# Patient Record
Sex: Female | Born: 1985 | Race: White | Hispanic: No | Marital: Married | Smoking: Never smoker
Health system: Southern US, Community
[De-identification: ages and names within clinical notes are randomized; demographics above are authoritative.]

## PROBLEM LIST (undated history)

## (undated) ENCOUNTER — Inpatient Hospital Stay (HOSPITAL_COMMUNITY): Payer: Self-pay

## (undated) DIAGNOSIS — Z789 Other specified health status: Secondary | ICD-10-CM

## (undated) HISTORY — PX: NO PAST SURGERIES: SHX2092

---

## 2005-02-14 ENCOUNTER — Other Ambulatory Visit: Admission: RE | Admit: 2005-02-14 | Discharge: 2005-02-14 | Payer: Self-pay | Admitting: Family Medicine

## 2005-05-11 ENCOUNTER — Other Ambulatory Visit: Admission: RE | Admit: 2005-05-11 | Discharge: 2005-05-11 | Payer: Self-pay | Admitting: Endocrinology

## 2005-06-22 ENCOUNTER — Other Ambulatory Visit: Admission: RE | Admit: 2005-06-22 | Discharge: 2005-06-22 | Payer: Self-pay | Admitting: Interventional Radiology

## 2005-06-22 ENCOUNTER — Encounter: Admission: RE | Admit: 2005-06-22 | Discharge: 2005-06-22 | Payer: Self-pay | Admitting: Surgery

## 2005-06-22 ENCOUNTER — Encounter (INDEPENDENT_AMBULATORY_CARE_PROVIDER_SITE_OTHER): Payer: Self-pay | Admitting: *Deleted

## 2006-01-30 DIAGNOSIS — E041 Nontoxic single thyroid nodule: Secondary | ICD-10-CM | POA: Insufficient documentation

## 2006-03-06 ENCOUNTER — Other Ambulatory Visit: Admission: RE | Admit: 2006-03-06 | Discharge: 2006-03-06 | Payer: Self-pay | Admitting: Family Medicine

## 2007-03-14 ENCOUNTER — Other Ambulatory Visit: Admission: RE | Admit: 2007-03-14 | Discharge: 2007-03-14 | Payer: Self-pay | Admitting: Family Medicine

## 2008-06-08 ENCOUNTER — Other Ambulatory Visit: Admission: RE | Admit: 2008-06-08 | Discharge: 2008-06-08 | Payer: Self-pay | Admitting: Family Medicine

## 2010-04-10 NOTE — L&D Delivery Note (Signed)
Delivery Note  SVD viable female Apgars 9,9 over 2nd degree MLE.  Placenta delivered spontaneously intact with 3VC. Repair with 2-0 Chromic with good support and hemostasis noted and R/V exam confirms.  PH art was done.  [place] cord blood was not done.  Mother and baby were doing well.  EBL 400  Candice Camp, MD

## 2010-08-03 LAB — HEPATITIS B SURFACE ANTIGEN: Hepatitis B Surface Ag: NEGATIVE

## 2010-08-03 LAB — RPR: RPR: NONREACTIVE

## 2011-02-25 ENCOUNTER — Inpatient Hospital Stay (HOSPITAL_COMMUNITY)
Admission: AD | Admit: 2011-02-25 | Discharge: 2011-02-26 | Disposition: A | Discharge status: Home / Self Care | Payer: BC Managed Care – PPO | Source: Ambulatory Visit | Attending: Obstetrics and Gynecology | Admitting: Obstetrics and Gynecology

## 2011-02-25 ENCOUNTER — Encounter (HOSPITAL_COMMUNITY): Payer: Self-pay | Admitting: *Deleted

## 2011-02-25 DIAGNOSIS — O479 False labor, unspecified: Secondary | ICD-10-CM | POA: Insufficient documentation

## 2011-02-25 HISTORY — DX: Other specified health status: Z78.9

## 2011-02-25 NOTE — Progress Notes (Signed)
G1 at 38.0wks. SVE Friday and was 3/90. Spotting since then. Called on call nurse and told to come in. Having some contractions.

## 2011-02-25 NOTE — Progress Notes (Signed)
Pt reports uc's of varying degrees of intensit all day. Pt has had spotting since cervical exam in office on Friday.

## 2011-03-05 ENCOUNTER — Inpatient Hospital Stay (HOSPITAL_COMMUNITY)
Admission: AD | Admit: 2011-03-05 | Discharge: 2011-03-08 | DRG: 373 | Disposition: A | Discharge status: Home / Self Care | Payer: BC Managed Care – PPO | Source: Ambulatory Visit | Attending: Obstetrics and Gynecology | Admitting: Obstetrics and Gynecology

## 2011-03-05 ENCOUNTER — Inpatient Hospital Stay (HOSPITAL_COMMUNITY): Payer: BC Managed Care – PPO | Admitting: Anesthesiology

## 2011-03-05 ENCOUNTER — Encounter (HOSPITAL_COMMUNITY): Payer: Self-pay | Admitting: Anesthesiology

## 2011-03-05 DIAGNOSIS — O99892 Other specified diseases and conditions complicating childbirth: Secondary | ICD-10-CM | POA: Diagnosis present

## 2011-03-05 DIAGNOSIS — Z22330 Carrier of Group B streptococcus: Secondary | ICD-10-CM

## 2011-03-05 LAB — CBC
MCH: 29.3 pg (ref 26.0–34.0)
MCHC: 34.2 g/dL (ref 30.0–36.0)
Platelets: 258 10*3/uL (ref 150–400)

## 2011-03-05 MED ORDER — PHENYLEPHRINE 40 MCG/ML (10ML) SYRINGE FOR IV PUSH (FOR BLOOD PRESSURE SUPPORT)
80.0000 ug | PREFILLED_SYRINGE | INTRAVENOUS | Status: DC | PRN
  Filled 2011-03-05: qty 5

## 2011-03-05 MED ORDER — ONDANSETRON HCL 4 MG/2ML IJ SOLN: 4.0000 mg | Freq: Four times a day (QID) | INTRAMUSCULAR | Status: DC | PRN

## 2011-03-05 MED ORDER — DEXTROSE 5 % IV SOLN
2.5000 10*6.[IU] | INTRAVENOUS | Status: DC
  Administered 2011-03-05 – 2011-03-06 (×3): 2.5 10*6.[IU] via INTRAVENOUS
  Filled 2011-03-05 (×5): qty 2.5

## 2011-03-05 MED ORDER — LACTATED RINGERS IV SOLN
500.0000 mL | INTRAVENOUS | Status: DC | PRN
  Administered 2011-03-06 (×2): 500 mL via INTRAVENOUS

## 2011-03-05 MED ORDER — LACTATED RINGERS IV SOLN
INTRAVENOUS | Status: DC
  Administered 2011-03-05: 125 mL/h via INTRAVENOUS
  Administered 2011-03-05 – 2011-03-06 (×3): via INTRAVENOUS

## 2011-03-05 MED ORDER — FLEET ENEMA 7-19 GM/118ML RE ENEM: 1.0000 | ENEMA | RECTAL | Status: DC | PRN

## 2011-03-05 MED ORDER — OXYTOCIN 20 UNITS IN LACTATED RINGERS INFUSION - SIMPLE
125.0000 mL/h | Freq: Once | INTRAVENOUS | Status: AC
  Administered 2011-03-06: 999 mL/h via INTRAVENOUS

## 2011-03-05 MED ORDER — LACTATED RINGERS IV SOLN
500.0000 mL | Freq: Once | INTRAVENOUS | Status: AC
  Administered 2011-03-05: 500 mL via INTRAVENOUS

## 2011-03-05 MED ORDER — ACETAMINOPHEN 325 MG PO TABS: 650.0000 mg | ORAL_TABLET | ORAL | Status: DC | PRN

## 2011-03-05 MED ORDER — PENICILLIN G POTASSIUM 5000000 UNITS IJ SOLR
5.0000 10*6.[IU] | Freq: Once | INTRAVENOUS | Status: AC
  Administered 2011-03-05: 5 10*6.[IU] via INTRAVENOUS
  Filled 2011-03-05: qty 5

## 2011-03-05 MED ORDER — EPHEDRINE 5 MG/ML INJ: 10.0000 mg | INTRAVENOUS | Status: DC | PRN

## 2011-03-05 MED ORDER — LIDOCAINE HCL (PF) 1 % IJ SOLN
30.0000 mL | INTRAMUSCULAR | Status: DC | PRN
  Filled 2011-03-05: qty 30

## 2011-03-05 MED ORDER — IBUPROFEN 600 MG PO TABS: 600.0000 mg | ORAL_TABLET | Freq: Four times a day (QID) | ORAL | Status: DC | PRN

## 2011-03-05 MED ORDER — FENTANYL 2.5 MCG/ML BUPIVACAINE 1/10 % EPIDURAL INFUSION (WH - ANES)
14.0000 mL/h | INTRAMUSCULAR | Status: DC
  Administered 2011-03-06 (×2): 14 mL/h via EPIDURAL
  Filled 2011-03-05 (×3): qty 60

## 2011-03-05 MED ORDER — CITRIC ACID-SODIUM CITRATE 334-500 MG/5ML PO SOLN: 30.0000 mL | ORAL | Status: DC | PRN

## 2011-03-05 MED ORDER — OXYCODONE-ACETAMINOPHEN 5-325 MG PO TABS: 2.0000 | ORAL_TABLET | ORAL | Status: DC | PRN

## 2011-03-05 MED ORDER — DIPHENHYDRAMINE HCL 50 MG/ML IJ SOLN: 12.5000 mg | INTRAMUSCULAR | Status: DC | PRN

## 2011-03-05 MED ORDER — FENTANYL 2.5 MCG/ML BUPIVACAINE 1/10 % EPIDURAL INFUSION (WH - ANES)
INTRAMUSCULAR | Status: DC | PRN
  Administered 2011-03-05: 14 mL/h via EPIDURAL

## 2011-03-05 MED ORDER — EPHEDRINE 5 MG/ML INJ
10.0000 mg | INTRAVENOUS | Status: DC | PRN
  Filled 2011-03-05: qty 4

## 2011-03-05 MED ORDER — OXYTOCIN BOLUS FROM INFUSION
500.0000 mL | Freq: Once | INTRAVENOUS | Status: DC
  Filled 2011-03-05: qty 1000
  Filled 2011-03-05: qty 500

## 2011-03-05 MED ORDER — PHENYLEPHRINE 40 MCG/ML (10ML) SYRINGE FOR IV PUSH (FOR BLOOD PRESSURE SUPPORT): 80.0000 ug | PREFILLED_SYRINGE | INTRAVENOUS | Status: DC | PRN

## 2011-03-05 NOTE — H&P (Signed)
Shevon Sian is a 25 y.o. female presenting at 18.1 with increasing cx's and cx ghange.  Post GBS> Maternal Medical History:  Reason for admission: Reason for admission: contractions.  Contractions: Frequency: regular.   Perceived severity is moderate.    Prenatal Complications - Diabetes: none.    OB History    Grav Para Term Preterm Abortions TAB SAB Ect Mult Living   1              Past Medical History  Diagnosis Date  . No pertinent past medical history    Past Surgical History  Procedure Date  . No past surgeries    Family History: family history is not on file. Social History:  reports that she has never smoked. She does not have any smokeless tobacco history on file. She reports that she does not drink alcohol or use illicit drugs.  ROS  Dilation: 4 Effacement (%): 80 Station: 0 Exam by:: felkelrn Blood pressure 119/78, pulse 101, temperature 98.7 F (37.1 C), temperature source Oral, resp. rate 20, height 5\' 9"  (1.753 m), weight 80.196 kg (176 lb 12.8 oz). Maternal Exam:  Uterine Assessment: Contraction strength is moderate.  Contraction frequency is irregular.   Abdomen: Fundal height is c/w dates.   Estimated fetal weight is 7+.   Fetal presentation: vertex  Introitus: Amniotic fluid character: not assessed.  Pelvis: adequate for delivery.   Cervix: Cervix evaluated by digital exam.     Physical Exam  Prenatal labs: ABO, Rh: A/Negative/-- (11/25 0000) Antibody: Negative (11/25 0000) Rubella: Immune (04/25 0000) RPR: Nonreactive (04/25 0000)  HBsAg: Negative (04/25 0000)  HIV: Non-reactive (04/25 0000)  GBS: Positive (10/31 0000)   Assessment/Plan: IUP at 39.1 early labor. Post GBS. IV PEN G. Routine L and D.   Veronia Laprise S 03/05/2011, 8:05 PM

## 2011-03-05 NOTE — Anesthesia Preprocedure Evaluation (Signed)

## 2011-03-05 NOTE — Anesthesia Procedure Notes (Addendum)
Epidural Patient location during procedure: OB  Preanesthetic Checklist Completed: patient identified, site marked, surgical consent, pre-op evaluation, timeout performed, IV checked, risks and benefits discussed and monitors and equipment checked  Epidural Patient position: sitting Prep: site prepped and draped and DuraPrep Patient monitoring: continuous pulse ox and blood pressure Approach: midline Injection technique: LOR air  Needle:  Needle type: Tuohy  Needle gauge: 17 G Needle length: 9 cm Needle insertion depth: 6 cm Catheter type: closed end flexible Catheter size: 19 Gauge Catheter at skin depth: 10 cm Test dose: negative  Assessment Events: blood not aspirated, injection not painful, no injection resistance, negative IV test and no paresthesia  Additional Notes Dosing of Epidural:  1st dose, through needle ............................................Marland Kitchen epi 1:200K + Xylocaine 40 mg  2nd dose, through catheter, after waiting 3 minutes...Marland KitchenMarland Kitchenepi 1:200K + Xylocaine 60 mg  3rd dose, through catheter after waiting 3 minutes .............................Marcaine   5mg    ( mg Marcaine are expressed as equivilent  cc's medication removed from the 0.1%Bupiv / fentanyl syringe from L&D pump)  ( 2% Xylo charted as a single dose in Epic Meds for ease of charting; actual dosing was fractionated as above, for saftey's sake)  As each dose occurred, patient was free of IV sx; and patient exhibited no evidence of SA injection.  Patient is more comfortable after epidural dosed. Please see RN's note for documentation of vital signs,and FHR which are stable.

## 2011-03-05 NOTE — Progress Notes (Signed)
Pt reports having ctx on and off since last night. Now 5-6 min apart. Reports having some leaking as well. Pt reports fetal movement a little less than usual.

## 2011-03-06 ENCOUNTER — Encounter (HOSPITAL_COMMUNITY): Payer: Self-pay | Admitting: *Deleted

## 2011-03-06 LAB — RPR: RPR Ser Ql: NONREACTIVE

## 2011-03-06 MED ORDER — MEDROXYPROGESTERONE ACETATE 150 MG/ML IM SUSP: 150.0000 mg | INTRAMUSCULAR | Status: DC | PRN

## 2011-03-06 MED ORDER — SODIUM CHLORIDE 0.9 % IJ SOLN
3.0000 mL | Freq: Two times a day (BID) | INTRAMUSCULAR | Status: DC
  Administered 2011-03-06: 3 mL via INTRAVENOUS

## 2011-03-06 MED ORDER — IBUPROFEN 600 MG PO TABS
600.0000 mg | ORAL_TABLET | Freq: Four times a day (QID) | ORAL | Status: DC
  Administered 2011-03-06 – 2011-03-08 (×8): 600 mg via ORAL
  Filled 2011-03-06 (×8): qty 1

## 2011-03-06 MED ORDER — MEASLES, MUMPS & RUBELLA VAC ~~LOC~~ INJ
0.5000 mL | INJECTION | Freq: Once | SUBCUTANEOUS | Status: DC
  Filled 2011-03-06: qty 0.5

## 2011-03-06 MED ORDER — BENZOCAINE-MENTHOL 20-0.5 % EX AERO
INHALATION_SPRAY | CUTANEOUS | Status: AC
  Filled 2011-03-06: qty 56

## 2011-03-06 MED ORDER — ONDANSETRON HCL 4 MG PO TABS: 4.0000 mg | ORAL_TABLET | ORAL | Status: DC | PRN

## 2011-03-06 MED ORDER — ZOLPIDEM TARTRATE 5 MG PO TABS: 5.0000 mg | ORAL_TABLET | Freq: Every evening | ORAL | Status: DC | PRN

## 2011-03-06 MED ORDER — SENNOSIDES-DOCUSATE SODIUM 8.6-50 MG PO TABS
2.0000 | ORAL_TABLET | Freq: Every day | ORAL | Status: DC
  Administered 2011-03-06 – 2011-03-07 (×2): 2 via ORAL

## 2011-03-06 MED ORDER — PRENATAL PLUS 27-1 MG PO TABS
1.0000 | ORAL_TABLET | Freq: Every day | ORAL | Status: DC
  Filled 2011-03-06: qty 1

## 2011-03-06 MED ORDER — BENZOCAINE-MENTHOL 20-0.5 % EX AERO: 1.0000 "application " | INHALATION_SPRAY | CUTANEOUS | Status: DC | PRN

## 2011-03-06 MED ORDER — SIMETHICONE 80 MG PO CHEW: 80.0000 mg | CHEWABLE_TABLET | ORAL | Status: DC | PRN

## 2011-03-06 MED ORDER — OXYCODONE-ACETAMINOPHEN 5-325 MG PO TABS
1.0000 | ORAL_TABLET | ORAL | Status: DC | PRN
  Administered 2011-03-07 (×2): 1 via ORAL
  Administered 2011-03-07: 2 via ORAL
  Filled 2011-03-06: qty 2
  Filled 2011-03-06 (×2): qty 1

## 2011-03-06 MED ORDER — ONDANSETRON HCL 4 MG/2ML IJ SOLN: 4.0000 mg | INTRAMUSCULAR | Status: DC | PRN

## 2011-03-06 MED ORDER — TETANUS-DIPHTH-ACELL PERTUSSIS 5-2.5-18.5 LF-MCG/0.5 IM SUSP: 0.5000 mL | Freq: Once | INTRAMUSCULAR | Status: DC

## 2011-03-06 MED ORDER — LANOLIN HYDROUS EX OINT: TOPICAL_OINTMENT | CUTANEOUS | Status: DC | PRN

## 2011-03-06 MED ORDER — DIBUCAINE 1 % RE OINT: 1.0000 "application " | TOPICAL_OINTMENT | RECTAL | Status: DC | PRN

## 2011-03-06 MED ORDER — DIPHENHYDRAMINE HCL 25 MG PO CAPS: 25.0000 mg | ORAL_CAPSULE | Freq: Four times a day (QID) | ORAL | Status: DC | PRN

## 2011-03-06 MED ORDER — WITCH HAZEL-GLYCERIN EX PADS: 1.0000 "application " | MEDICATED_PAD | CUTANEOUS | Status: DC | PRN

## 2011-03-06 MED ORDER — PRENATAL PLUS 27-1 MG PO TABS
1.0000 | ORAL_TABLET | Freq: Every day | ORAL | Status: DC
  Administered 2011-03-07 – 2011-03-08 (×2): 1 via ORAL
  Filled 2011-03-06: qty 1

## 2011-03-06 NOTE — Anesthesia Postprocedure Evaluation (Signed)
  Anesthesia Post-op Note  Patient: Elizabeth [name]  Procedure(s) Performed: * No procedures listed *  Patient Location: PACU and Mother/Baby  Anesthesia Type: Epidural  Level of Consciousness: awake  Airway and Oxygen Therapy: Patient Spontanous Breathing  Post-op Pain: none  Post-op Assessment: Post-op Vital signs reviewed  Post-op Vital Signs: Reviewed and stable  Complications: No apparent anesthesia complications

## 2011-03-06 NOTE — Progress Notes (Signed)
Patient ID: Elizabeth [name], female   DOB: 05/22/1985, 25 y.o.   MRN: [id number] Fetal decel to 90's for 2 minutes now back up Probable srom clear cx 6-7 100 % vtx -1 FHR now up to 140's accel with scalp stim  Will follow

## 2011-03-07 LAB — CBC
MCH: 28.7 pg (ref 26.0–34.0)
MCHC: 32.9 g/dL (ref 30.0–36.0)
MCV: 87.3 fL (ref 78.0–100.0)
Platelets: 175 10*3/uL (ref 150–400)
RBC: 4.08 MIL/uL (ref 3.87–5.11)

## 2011-03-07 NOTE — Progress Notes (Signed)
Post Partum Day 1 Subjective: no complaints, up ad lib and voiding  Objective: Blood pressure 107/67, pulse 83, temperature 98.5 F (36.9 C), temperature source Oral, resp. rate 18, height 5\' 9"  (1.753 m), weight 80.196 kg (176 lb 12.8 oz), SpO2 98.00%, unknown if currently breastfeeding.  Physical Exam:  General: alert and cooperative Lochia: appropriate Uterine Fundus: firm Perineum intact DVT Evaluation: No evidence of DVT seen on physical exam.   Basename 03/07/11 0514 03/05/11 1841  HGB 11.7* 13.7  HCT 35.6* 40.1    Assessment/Plan: Plan for discharge tomorrow   LOS: 2 days   Tiahna Cure G 03/07/2011, 8:17 AM

## 2011-03-08 MED ORDER — OXYCODONE-ACETAMINOPHEN 5-325 MG PO TABS: 1.0000 | ORAL_TABLET | ORAL | Status: AC | PRN

## 2011-03-08 MED ORDER — IBUPROFEN 600 MG PO TABS: 600.0000 mg | ORAL_TABLET | Freq: Four times a day (QID) | ORAL | Status: AC

## 2011-03-08 NOTE — Progress Notes (Signed)
Post Partum Day 2 Subjective: no complaints, up ad lib, voiding, tolerating PO and + flatus  Objective: Blood pressure 118/72, pulse 84, temperature 97.5 F (36.4 C), temperature source Oral, resp. rate 18, height 5\' 9"  (1.753 m), weight 80.196 kg (176 lb 12.8 oz), SpO2 99.00%, unknown if currently breastfeeding.  Physical Exam:  General: alert and cooperative Lochia: appropriate Uterine Fundus: firm Perineum intact DVT Evaluation: No evidence of DVT seen on physical exam.   Basename 03/07/11 0514 03/05/11 1841  HGB 11.7* 13.7  HCT 35.6* 40.1    Assessment/Plan: Discharge home   LOS: 3 days   Elizabeth [name] G 03/08/2011, 8:32 AM

## 2011-03-08 NOTE — Discharge Summary (Signed)
Obstetric Discharge Summary Reason for Admission: onset of labor Prenatal Procedures: ultrasound Intrapartum Procedures: spontaneous vaginal delivery Postpartum Procedures: none Complications-Operative and Postpartum: 2 degree perineal laceration Hemoglobin  Date Value Range Status  03/07/2011 11.7* 12.0-15.0 ([name]/dL) Final     HCT  Date Value Range Status  03/07/2011 35.6* 36.0-46.0 (%) Final    Discharge Diagnoses: Term Pregnancy-delivered  Discharge Information: Date: 03/08/2011 Activity: pelvic rest Diet: routine Medications: PNV, Ibuprofen and Percocet Condition: stable Instructions: refer to practice specific booklet Discharge to: home   Newborn Data: Live born female  Birth Weight: 8 lb 1 oz (3657 [name]) APGAR: 9, 9  Home with mother.  Elizabeth [name] 03/08/2011, 9:00 AM

## 2012-03-06 ENCOUNTER — Inpatient Hospital Stay (HOSPITAL_COMMUNITY)
Admission: AD | Admit: 2012-03-06 | Discharge: 2012-03-06 | Disposition: A | Discharge status: Home / Self Care | Payer: BC Managed Care – PPO | Source: Ambulatory Visit | Attending: Obstetrics and Gynecology | Admitting: Obstetrics and Gynecology

## 2012-03-06 ENCOUNTER — Encounter (HOSPITAL_COMMUNITY): Payer: Self-pay | Admitting: *Deleted

## 2012-03-06 ENCOUNTER — Inpatient Hospital Stay (HOSPITAL_COMMUNITY): Payer: BC Managed Care – PPO

## 2012-03-06 DIAGNOSIS — O039 Complete or unspecified spontaneous abortion without complication: Secondary | ICD-10-CM

## 2012-03-06 MED ORDER — RHO D IMMUNE GLOBULIN 1500 UNIT/2ML IJ SOLN
300.0000 ug | Freq: Once | INTRAMUSCULAR | Status: DC
  Filled 2012-03-06: qty 2

## 2012-03-06 NOTE — MAU Note (Signed)
Pt reports having spotting  On and off since Sunday. Went to Appointment yesterday and had U/S . Told everything was OK Had a FHR. Today bleeding has been getting heavier and passing  Small golf ball sized clots. Having mild cramping as well

## 2012-03-06 NOTE — Progress Notes (Signed)
U/S C/W complete AB No retained POC  D/W patient and husband SAB. Instructions given, pelvic rest.  FU in office 2 weeks-patient will call. Rh negative-will need rhogam unless recently given.

## 2012-03-06 NOTE — MAU Provider Note (Signed)
History     CSN: [id number]  Arrival date and time: 03/06/12 2024   First Provider Initiated Contact with Patient 03/06/12 2103      Chief Complaint  Patient presents with  . Vaginal Bleeding   HPI  Pt is [redacted]w[redacted]d pregnant and has been spotting since Sunday night - she was seen in the office yesterday with ultrasound showing cardiac activity.  She started bleeding heavier this afternoon with clots.  She has not passed any tissue.  Past Medical History  Diagnosis Date  . No pertinent past medical history     Past Surgical History  Procedure Date  . No past surgeries     No family history on file.  History  Substance Use Topics  . Smoking status: Never Smoker   . Smokeless tobacco: Not on file  . Alcohol Use: No    Allergies:  Allergies  Allergen Reactions  . Neomycin Swelling    Eyelid swell shut    Prescriptions prior to admission  Medication Sig Dispense Refill  . prenatal vitamin w/FE, FA (PRENATAL 1 + 1) 27-1 MG TABS Take 1 tablet by mouth daily.          Review of Systems  Constitutional: Negative for fever and chills.  Gastrointestinal: Positive for abdominal pain. Negative for nausea, vomiting, diarrhea and constipation.  Genitourinary: Negative for dysuria and urgency.  Neurological: Negative for headaches.   Physical Exam   Blood pressure 123/67, pulse 85, temperature 98.2 F (36.8 C), temperature source Oral, resp. rate 18, height 5\' 9"  (1.753 m), weight 149 lb 9.6 oz (67.858 kg), last menstrual period 01/13/2012, unknown if currently breastfeeding.  Physical Exam  Nursing note and vitals reviewed. Constitutional: She is oriented to person, place, and time. She appears well-developed and well-nourished.       tearful  HENT:  Head: Normocephalic.  Eyes: Pupils are equal, round, and reactive to light.  Neck: Normal range of motion. Neck supple.  Cardiovascular: Normal rate.   Respiratory: Effort normal.  GI: Soft.  Genitourinary:   Deferred- pt went to ultrasound  Musculoskeletal: Normal range of motion.  Neurological: She is alert and oriented to person, place, and time.  Skin: Skin is warm and dry.  Psychiatric: She has a normal mood and affect.    MAU Course  Procedures Complete SAB in ultrasound; Dr. Henderson Cloud here to see pt and discuss RADIOLOGY REPORT*  Clinical Data: Vaginal bleeding.  OBSTETRIC <14 WK [place] AND TRANSVAGINAL OB US  Technique: Both transabdominal and transvaginal ultrasound  examinations were performed for complete evaluation of the  gestation as well as the maternal uterus, adnexal regions, and  pelvic cul-de-sac. Transvaginal technique was performed to assess  early pregnancy.  Comparison: None.  Intrauterine gestational sac: None seen.  Yolk sac: N/A  Embryo: N/A  Maternal uterus/adnexae:  The endometrial echo complex measures 1.8 cm in thickness, likely  reflecting recent postpartum state. Trace fluid is noted along the  endometrial canal; there may be a small amount of associated clot.  No retained products of conception are identified.  The ovaries are within normal limits. The right ovary measures 3.2  x 1.7 x 1.4 cm, while the left ovary measures 3.3 x 2.2 x 1.9 cm.  No suspicious adnexal masses are seen; there is no evidence for  ovarian torsion.  No free fluid is seen within the pelvic cul-de-sac.  IMPRESSION:  No intrauterine gestational sac seen. No evidence for retained  products of conception. Trace fluid and likely small  amount of  associated clot along the endometrial canal. Otherwise  unremarkable pelvic ultrasound.  Original Report Authenticated By: Tonia Ghent, M.D.          Assessment and Plan  Complete SAB Rh Neg- lab drawn for workup Pt will return on Friday 11/29 for injection  Ayva Veilleux 03/06/2012, 9:04 PM

## 2012-03-06 NOTE — Progress Notes (Signed)
Pt and husband visible upset and teary eyed. Gave them time lone to grieve. Comfort pack given

## 2012-03-08 ENCOUNTER — Inpatient Hospital Stay (HOSPITAL_COMMUNITY)
Admission: AD | Admit: 2012-03-08 | Discharge: 2012-03-08 | Disposition: A | Discharge status: Home / Self Care | Payer: BC Managed Care – PPO | Source: Ambulatory Visit | Attending: Obstetrics & Gynecology | Admitting: Obstetrics & Gynecology

## 2012-03-08 DIAGNOSIS — Z2989 Encounter for other specified prophylactic measures: Secondary | ICD-10-CM | POA: Insufficient documentation

## 2012-03-08 DIAGNOSIS — Z298 Encounter for other specified prophylactic measures: Secondary | ICD-10-CM | POA: Insufficient documentation

## 2012-03-08 MED ORDER — RHO D IMMUNE GLOBULIN 1500 UNIT/2ML IJ SOLN
300.0000 ug | Freq: Once | INTRAMUSCULAR | Status: AC
  Administered 2012-03-08: 300 ug via INTRAMUSCULAR
  Filled 2012-03-08: qty 2

## 2012-03-09 LAB — RH IG WORKUP (INCLUDES ABO/RH): Unit division: 0

## 2012-09-24 LAB — OB RESULTS CONSOLE GC/CHLAMYDIA
CHLAMYDIA, DNA PROBE: NEGATIVE
Gonorrhea: NEGATIVE

## 2012-09-24 LAB — OB RESULTS CONSOLE HEPATITIS B SURFACE ANTIGEN: Hepatitis B Surface Ag: NEGATIVE

## 2012-09-24 LAB — OB RESULTS CONSOLE RPR: RPR: NONREACTIVE

## 2012-09-24 LAB — OB RESULTS CONSOLE ANTIBODY SCREEN: Antibody Screen: NEGATIVE

## 2012-09-24 LAB — OB RESULTS CONSOLE ABO/RH: RH Type: NEGATIVE

## 2012-09-24 LAB — OB RESULTS CONSOLE RUBELLA ANTIBODY, IGM: Rubella: IMMUNE

## 2012-09-24 LAB — OB RESULTS CONSOLE HIV ANTIBODY (ROUTINE TESTING): HIV: NONREACTIVE

## 2013-01-08 ENCOUNTER — Ambulatory Visit (HOSPITAL_COMMUNITY)
Admission: RE | Admit: 2013-01-08 | Discharge: 2013-01-08 | Disposition: A | Discharge status: Home / Self Care | Payer: BC Managed Care – PPO | Source: Ambulatory Visit | Attending: Obstetrics & Gynecology | Admitting: Obstetrics & Gynecology

## 2013-01-08 ENCOUNTER — Other Ambulatory Visit (HOSPITAL_COMMUNITY): Payer: Self-pay | Admitting: Obstetrics & Gynecology

## 2013-01-08 DIAGNOSIS — Z0489 Encounter for examination and observation for other specified reasons: Secondary | ICD-10-CM

## 2013-01-09 ENCOUNTER — Encounter (HOSPITAL_COMMUNITY): Payer: Self-pay | Admitting: *Deleted

## 2013-01-14 ENCOUNTER — Ambulatory Visit (HOSPITAL_COMMUNITY): Admission: RE | Admit: 2013-01-14 | Payer: BC Managed Care – PPO | Source: Ambulatory Visit

## 2013-01-14 ENCOUNTER — Ambulatory Visit (HOSPITAL_COMMUNITY)
Admission: RE | Admit: 2013-01-14 | Discharge: 2013-01-14 | Disposition: A | Discharge status: Home / Self Care | Payer: BC Managed Care – PPO | Source: Ambulatory Visit | Attending: Obstetrics & Gynecology | Admitting: Obstetrics & Gynecology

## 2013-01-14 DIAGNOSIS — Z0489 Encounter for examination and observation for other specified reasons: Secondary | ICD-10-CM

## 2013-01-14 DIAGNOSIS — Z363 Encounter for antenatal screening for malformations: Secondary | ICD-10-CM | POA: Insufficient documentation

## 2013-01-14 DIAGNOSIS — Z1389 Encounter for screening for other disorder: Secondary | ICD-10-CM | POA: Insufficient documentation

## 2013-01-16 ENCOUNTER — Ambulatory Visit (HOSPITAL_COMMUNITY): Payer: BC Managed Care – PPO

## 2013-04-07 LAB — OB RESULTS CONSOLE GBS: STREP GROUP B AG: NEGATIVE

## 2013-04-10 NOTE — L&D Delivery Note (Signed)
Delivery Note At 10:34 PM a viable female was delivered via  (Presentation: vertex; ROA position).  Female placed on mom's abdomen and cord clamped x 2 and cut. Cord blood obtained.  Placenta status: delivered spontaneously and intact with 3 VC.    Anesthesia:  Local Episiotomy:  None  Lacerations:  2nd degree with right labial repaired Suture Repair: 3.0 vicryl rapide Est. Blood Loss (mL): 500 cc  Mom to postpartum.  Baby to Couplet care / Skin to Skin.  Ishmail Mcmanamon S 04/21/2013, 11:36 PM

## 2013-04-21 ENCOUNTER — Inpatient Hospital Stay (HOSPITAL_COMMUNITY)
Admission: AD | Admit: 2013-04-21 | Discharge: 2013-04-23 | DRG: 775 | Disposition: A | Discharge status: Home / Self Care | Payer: BC Managed Care – PPO | Source: Ambulatory Visit | Attending: Obstetrics and Gynecology | Admitting: Obstetrics and Gynecology

## 2013-04-21 ENCOUNTER — Encounter (HOSPITAL_COMMUNITY): Payer: Self-pay

## 2013-04-21 MED ORDER — LIDOCAINE HCL (PF) 1 % IJ SOLN
30.0000 mL | INTRAMUSCULAR | Status: DC | PRN
  Filled 2013-04-21: qty 30

## 2013-04-21 MED ORDER — CITRIC ACID-SODIUM CITRATE 334-500 MG/5ML PO SOLN: 30.0000 mL | ORAL | Status: DC | PRN

## 2013-04-21 MED ORDER — LIDOCAINE HCL (PF) 1 % IJ SOLN
INTRAMUSCULAR | Status: AC
  Filled 2013-04-21: qty 30

## 2013-04-21 MED ORDER — LACTATED RINGERS IV SOLN: 500.0000 mL | INTRAVENOUS | Status: DC | PRN

## 2013-04-21 MED ORDER — IBUPROFEN 600 MG PO TABS
600.0000 mg | ORAL_TABLET | Freq: Four times a day (QID) | ORAL | Status: DC | PRN
Start: 2013-04-21 — End: 2013-04-22
  Filled 2013-04-21: qty 1

## 2013-04-21 MED ORDER — OXYTOCIN 40 UNITS IN LACTATED RINGERS INFUSION - SIMPLE MED: 62.5000 mL/h | INTRAVENOUS | Status: DC

## 2013-04-21 MED ORDER — ACETAMINOPHEN 325 MG PO TABS: 650.0000 mg | ORAL_TABLET | ORAL | Status: DC | PRN

## 2013-04-21 MED ORDER — LACTATED RINGERS IV SOLN: INTRAVENOUS | Status: DC

## 2013-04-21 MED ORDER — FLEET ENEMA 7-19 GM/118ML RE ENEM: 1.0000 | ENEMA | RECTAL | Status: DC | PRN

## 2013-04-21 MED ORDER — IBUPROFEN 600 MG PO TABS
600.0000 mg | ORAL_TABLET | Freq: Four times a day (QID) | ORAL | Status: DC | PRN
  Administered 2013-04-22: 600 mg via ORAL
  Filled 2013-04-21: qty 1

## 2013-04-21 MED ORDER — OXYTOCIN 40 UNITS IN LACTATED RINGERS INFUSION - SIMPLE MED
INTRAVENOUS | Status: AC
  Filled 2013-04-21: qty 1000

## 2013-04-21 MED ORDER — ONDANSETRON HCL 4 MG/2ML IJ SOLN: 4.0000 mg | Freq: Four times a day (QID) | INTRAMUSCULAR | Status: DC | PRN

## 2013-04-21 MED ORDER — OXYCODONE-ACETAMINOPHEN 5-325 MG PO TABS: 1.0000 | ORAL_TABLET | ORAL | Status: DC | PRN

## 2013-04-21 MED ORDER — OXYTOCIN BOLUS FROM INFUSION
500.0000 mL | INTRAVENOUS | Status: DC
  Administered 2013-04-21: 500 mL via INTRAVENOUS

## 2013-04-21 MED ORDER — OXYCODONE-ACETAMINOPHEN 5-325 MG PO TABS
1.0000 | ORAL_TABLET | ORAL | Status: DC | PRN
  Administered 2013-04-21: 1 via ORAL
  Filled 2013-04-21: qty 2

## 2013-04-21 MED ORDER — LIDOCAINE HCL (PF) 1 % IJ SOLN
30.0000 mL | INTRAMUSCULAR | Status: DC | PRN
  Administered 2013-04-21: 30 mL via SUBCUTANEOUS
  Filled 2013-04-21: qty 30

## 2013-04-21 MED ORDER — OXYTOCIN BOLUS FROM INFUSION: 500.0000 mL | INTRAVENOUS | Status: DC

## 2013-04-21 MED ORDER — LACTATED RINGERS IV SOLN
INTRAVENOUS | Status: DC
  Administered 2013-04-21: 1000 mL via INTRAVENOUS

## 2013-04-21 NOTE — Progress Notes (Signed)
Called at home 10:20 PM re pt ad to MAU @ 8cm, advised on way See H+PE written at 10:41 in house >>>  called again at 11:31 PM, advised pt del by Dr Shawnie PonsPratt.  I went in to see pt with family, baby on her chest.  Requested charge RN write up SZP to report poor communication, I was never called after 8cm admission with request for epidural, never advised to come for delivery, never called to be informed pt had delivered

## 2013-04-21 NOTE — H&P (Signed)
Elizabeth [name] is a 28 y.o. female presenting for SOL. Maternal Medical History:  Reason for admission: Contractions.   Contractions: Onset was 3-5 hours ago.   Frequency: regular.   Perceived severity is moderate.    Fetal activity: Perceived fetal activity is normal.   Last perceived fetal movement was within the past hour.      OB History   Grav Para Term Preterm Abortions TAB SAB Ect Mult Living   3 1 1       1      Past Medical History  Diagnosis Date  . No pertinent past medical history    Past Surgical History  Procedure Laterality Date  . No past surgeries     Family History: family history is not on file. Social History:  reports that she has never smoked. She does not have any smokeless tobacco history on file. She reports that she does not drink alcohol or use illicit drugs.   Prenatal Transfer Tool  Maternal Diabetes: No Genetic Screening: Normal Maternal Ultrasounds/Referrals: Normal Fetal Ultrasounds or other Referrals:  None Maternal Substance Abuse:  No Significant Maternal Medications:  None Significant Maternal Lab Results:  None Other Comments:  None  ROS  Dilation: 7.5 Effacement (%): 90 Station: 0 Exam by:: Elie ConferK. Weiss RN Blood pressure 131/68, pulse 90, unknown if currently breastfeeding. Exam Physical Exam  Constitutional: She is oriented to person, place, and time. She appears well-developed and well-nourished.  HENT:  Head: Atraumatic.  Cardiovascular: Normal rate and regular rhythm.   Respiratory: Effort normal and breath sounds normal.  GI:  term FH/FHR 142  Genitourinary:  7-8 cm on adm to MAU  Musculoskeletal: Normal range of motion.  Neurological: She is alert and oriented to person, place, and time.    Prenatal labs: ABO, Rh: A/Negative/-- (06/17 0000) Antibody: Negative (06/17 0000) Rubella: Immune (06/17 0000) RPR: Nonreactive (06/17 0000)  HBsAg: Negative (06/17 0000)  HIV: Non-reactive (06/17 0000)  GBS: Negative (12/29  0000)   Assessment/Plan: Term IUP/labor   Meriel PicaHOLLAND,Nekeisha Aure M 04/21/2013, 10:41 PM

## 2013-04-22 ENCOUNTER — Encounter (HOSPITAL_COMMUNITY): Payer: Self-pay | Admitting: *Deleted

## 2013-04-22 LAB — CBC
HEMATOCRIT: 31.6 % — AB (ref 36.0–46.0)
Hemoglobin: 10.7 g/dL — ABNORMAL LOW (ref 12.0–15.0)
MCH: 28.2 pg (ref 26.0–34.0)
MCHC: 33.9 g/dL (ref 30.0–36.0)
MCV: 83.4 fL (ref 78.0–100.0)
Platelets: 230 10*3/uL (ref 150–400)
RBC: 3.79 MIL/uL — ABNORMAL LOW (ref 3.87–5.11)
RDW: 14 % (ref 11.5–15.5)
WBC: 17.7 10*3/uL — AB (ref 4.0–10.5)

## 2013-04-22 LAB — RPR: RPR: NONREACTIVE

## 2013-04-22 MED ORDER — IBUPROFEN 600 MG PO TABS
600.0000 mg | ORAL_TABLET | Freq: Four times a day (QID) | ORAL | Status: DC
  Administered 2013-04-22 – 2013-04-23 (×5): 600 mg via ORAL
  Filled 2013-04-22 (×4): qty 1

## 2013-04-22 MED ORDER — WITCH HAZEL-GLYCERIN EX PADS: 1.0000 "application " | MEDICATED_PAD | CUTANEOUS | Status: DC | PRN

## 2013-04-22 MED ORDER — BENZOCAINE-MENTHOL 20-0.5 % EX AERO
1.0000 "application " | INHALATION_SPRAY | CUTANEOUS | Status: DC | PRN
  Administered 2013-04-22: 1 via TOPICAL
  Filled 2013-04-22: qty 56

## 2013-04-22 MED ORDER — ONDANSETRON HCL 4 MG/2ML IJ SOLN: 4.0000 mg | INTRAMUSCULAR | Status: DC | PRN

## 2013-04-22 MED ORDER — TETANUS-DIPHTH-ACELL PERTUSSIS 5-2.5-18.5 LF-MCG/0.5 IM SUSP: 0.5000 mL | Freq: Once | INTRAMUSCULAR | Status: DC

## 2013-04-22 MED ORDER — ZOLPIDEM TARTRATE 5 MG PO TABS: 5.0000 mg | ORAL_TABLET | Freq: Every evening | ORAL | Status: DC | PRN

## 2013-04-22 MED ORDER — OXYCODONE-ACETAMINOPHEN 5-325 MG PO TABS: 1.0000 | ORAL_TABLET | ORAL | Status: DC | PRN

## 2013-04-22 MED ORDER — DIPHENHYDRAMINE HCL 25 MG PO CAPS: 25.0000 mg | ORAL_CAPSULE | Freq: Four times a day (QID) | ORAL | Status: DC | PRN

## 2013-04-22 MED ORDER — SENNOSIDES-DOCUSATE SODIUM 8.6-50 MG PO TABS
2.0000 | ORAL_TABLET | ORAL | Status: DC
  Administered 2013-04-22: 2 via ORAL
  Filled 2013-04-22: qty 2

## 2013-04-22 MED ORDER — PRENATAL MULTIVITAMIN CH
1.0000 | ORAL_TABLET | Freq: Every day | ORAL | Status: DC
  Administered 2013-04-22: 1 via ORAL
  Filled 2013-04-22: qty 1

## 2013-04-22 MED ORDER — LANOLIN HYDROUS EX OINT: TOPICAL_OINTMENT | CUTANEOUS | Status: DC | PRN

## 2013-04-22 MED ORDER — ONDANSETRON HCL 4 MG PO TABS: 4.0000 mg | ORAL_TABLET | ORAL | Status: DC | PRN

## 2013-04-22 MED ORDER — SIMETHICONE 80 MG PO CHEW: 80.0000 mg | CHEWABLE_TABLET | ORAL | Status: DC | PRN

## 2013-04-22 MED ORDER — DIBUCAINE 1 % RE OINT: 1.0000 "application " | TOPICAL_OINTMENT | RECTAL | Status: DC | PRN

## 2013-04-22 NOTE — Progress Notes (Signed)
UR completed 

## 2013-04-22 NOTE — Progress Notes (Signed)
Post Partum Day 1 Subjective: up ad lib, voiding and tolerating PO  Objective: Blood pressure 102/65, pulse 73, temperature 97.6 F (36.4 C), temperature source Oral, resp. rate 16, unknown if currently breastfeeding.  Physical Exam:  General: alert and cooperative Lochia: appropriate Uterine Fundus: firm Incision: perineum intact, edematous area noted on r labia DVT Evaluation: No evidence of DVT seen on physical exam. Negative Homan's sign. No cords or calf tenderness. No significant calf/ankle edema.   Recent Labs  04/22/13 0555  HGB 10.7*  HCT 31.6*    Assessment/Plan: Plan for discharge tomorrow   LOS: 1 day   CURTIS,CAROL G 04/22/2013, 8:14 AM

## 2013-04-23 LAB — CBC
HCT: 32.1 % — ABNORMAL LOW (ref 36.0–46.0)
Hemoglobin: 10.6 g/dL — ABNORMAL LOW (ref 12.0–15.0)
MCH: 28.2 pg (ref 26.0–34.0)
MCHC: 33 g/dL (ref 30.0–36.0)
MCV: 85.4 fL (ref 78.0–100.0)
Platelets: 222 10*3/uL (ref 150–400)
RBC: 3.76 MIL/uL — AB (ref 3.87–5.11)
RDW: 14.6 % (ref 11.5–15.5)
WBC: 11 10*3/uL — AB (ref 4.0–10.5)

## 2013-04-23 LAB — TYPE AND SCREEN
ABO/RH(D): A NEG
ANTIBODY SCREEN: POSITIVE
DAT, IgG: NEGATIVE
UNIT DIVISION: 0
Unit division: 0

## 2013-04-23 MED ORDER — IBUPROFEN 600 MG PO TABS: 600.0000 mg | ORAL_TABLET | Freq: Four times a day (QID) | ORAL | Status: DC | PRN

## 2013-04-23 NOTE — Discharge Summary (Signed)
Obstetric Discharge Summary Reason for Admission: onset of labor Prenatal Procedures: ultrasound Intrapartum Procedures: spontaneous vaginal delivery Postpartum Procedures: none Complications-Operative and Postpartum: 2 degree perineal laceration Hemoglobin  Date Value Range Status  04/23/2013 10.6* 12.0 - 15.0 g/dL Final     HCT  Date Value Range Status  04/23/2013 32.1* 36.0 - 46.0 % Final    Physical Exam:  General: alert and cooperative Lochia: appropriate Uterine Fundus: firm Incision: perineum intact,right  labial varicosities improved DVT Evaluation: No evidence of DVT seen on physical exam. Negative Homan's sign. No cords or calf tenderness. No significant calf/ankle edema.  Discharge Diagnoses: Term Pregnancy-delivered  Discharge Information: Date: 04/23/2013 Activity: pelvic rest Diet: routine Medications: PNV and Ibuprofen Condition: stable Instructions: refer to practice specific booklet Discharge to: home   Newborn Data: Live born female  Birth Weight: 7 lb 10.8 oz (3481 g) APGAR: 8, 9  Home with mother.  Elizabeth [name] G 04/23/2013, 8:05 AM

## 2014-02-09 ENCOUNTER — Encounter (HOSPITAL_COMMUNITY): Payer: Self-pay | Admitting: *Deleted

## 2014-03-12 LAB — OB RESULTS CONSOLE ANTIBODY SCREEN: Antibody Screen: NEGATIVE

## 2014-03-12 LAB — OB RESULTS CONSOLE RUBELLA ANTIBODY, IGM: RUBELLA: IMMUNE

## 2014-03-12 LAB — OB RESULTS CONSOLE ABO/RH: RH Type: NEGATIVE

## 2014-03-12 LAB — OB RESULTS CONSOLE HIV ANTIBODY (ROUTINE TESTING): HIV: NONREACTIVE

## 2014-03-12 LAB — OB RESULTS CONSOLE RPR: RPR: NONREACTIVE

## 2014-03-12 LAB — OB RESULTS CONSOLE HEPATITIS B SURFACE ANTIGEN: Hepatitis B Surface Ag: NEGATIVE

## 2014-04-10 NOTE — L&D Delivery Note (Addendum)
Delivery Note At 3:50 AM a viable and healthy female was delivered via Vaginal, Spontaneous Delivery (Presentation: ; Occiput Anterior).  APGAR: 8, 9; weight -pending Placenta status: Intact, Spontaneous.  Cord: no complications.  Cord pH: n/a  Anesthesia: Epidural  Episiotomy: None Lacerations: 2nd degree Suture Repair: 3.0 vicryl rapide Est. Blood Loss (mL):  350 cc  Most bleeding was from perineal vein and was controlled with several stitches.   Mom to postpartum.  Baby to Couplet care / Skin to Skin.  Norrine Ballester R 10/10/2014, 4:28 AM

## 2014-08-14 LAB — OB RESULTS CONSOLE GBS
GBS: POSITIVE
GBS: POSITIVE
STREP GROUP B AG: POSITIVE
STREP GROUP B AG: POSITIVE

## 2014-08-17 LAB — OB RESULTS CONSOLE GBS: GBS: POSITIVE

## 2014-09-10 LAB — OB RESULTS CONSOLE GBS
GBS: POSITIVE
STREP GROUP B AG: POSITIVE

## 2014-09-21 LAB — OB RESULTS CONSOLE GBS: STREP GROUP B AG: POSITIVE

## 2014-10-02 ENCOUNTER — Encounter (HOSPITAL_COMMUNITY): Payer: Self-pay | Admitting: *Deleted

## 2014-10-02 ENCOUNTER — Inpatient Hospital Stay (HOSPITAL_COMMUNITY)
Admission: AD | Admit: 2014-10-02 | Discharge: 2014-10-02 | Disposition: A | Discharge status: Home / Self Care | Payer: No Typology Code available for payment source | Source: Ambulatory Visit | Attending: Obstetrics | Admitting: Obstetrics

## 2014-10-02 DIAGNOSIS — Z3493 Encounter for supervision of normal pregnancy, unspecified, third trimester: Secondary | ICD-10-CM | POA: Insufficient documentation

## 2014-10-02 DIAGNOSIS — Z3A38 38 weeks gestation of pregnancy: Secondary | ICD-10-CM | POA: Insufficient documentation

## 2014-10-02 HISTORY — DX: Other specified health status: Z78.9

## 2014-10-02 NOTE — MAU Note (Signed)
Pt reports having ctx abut q5-7 min since about 1230. Denies SROM or bleeding and reports good fetal movement

## 2014-10-07 ENCOUNTER — Telehealth (HOSPITAL_COMMUNITY): Payer: Self-pay | Admitting: *Deleted

## 2014-10-07 ENCOUNTER — Encounter (HOSPITAL_COMMUNITY): Payer: Self-pay | Admitting: *Deleted

## 2014-10-07 ENCOUNTER — Other Ambulatory Visit: Payer: Self-pay | Admitting: Obstetrics & Gynecology

## 2014-10-07 NOTE — Telephone Encounter (Signed)
Preadmission screen  

## 2014-10-09 ENCOUNTER — Inpatient Hospital Stay (HOSPITAL_COMMUNITY)
Admission: AD | Admit: 2014-10-09 | Discharge: 2014-10-11 | DRG: 775 | Disposition: A | Discharge status: Home / Self Care | Payer: No Typology Code available for payment source | Source: Ambulatory Visit | Attending: Obstetrics & Gynecology | Admitting: Obstetrics & Gynecology

## 2014-10-09 ENCOUNTER — Inpatient Hospital Stay (HOSPITAL_COMMUNITY): Payer: No Typology Code available for payment source | Admitting: Anesthesiology

## 2014-10-09 ENCOUNTER — Inpatient Hospital Stay (HOSPITAL_COMMUNITY): Admission: RE | Admit: 2014-10-09 | Payer: MEDICAID | Source: Ambulatory Visit

## 2014-10-09 DIAGNOSIS — Z3483 Encounter for supervision of other normal pregnancy, third trimester: Secondary | ICD-10-CM | POA: Diagnosis present

## 2014-10-09 DIAGNOSIS — O26899 Other specified pregnancy related conditions, unspecified trimester: Secondary | ICD-10-CM | POA: Diagnosis present

## 2014-10-09 DIAGNOSIS — O99824 Streptococcus B carrier state complicating childbirth: Principal | ICD-10-CM | POA: Diagnosis present

## 2014-10-09 DIAGNOSIS — Z3A39 39 weeks gestation of pregnancy: Secondary | ICD-10-CM | POA: Diagnosis present

## 2014-10-09 DIAGNOSIS — Z6791 Unspecified blood type, Rh negative: Secondary | ICD-10-CM | POA: Diagnosis present

## 2014-10-09 DIAGNOSIS — IMO0001 Reserved for inherently not codable concepts without codable children: Secondary | ICD-10-CM

## 2014-10-09 LAB — CBC
HEMATOCRIT: 38.7 % (ref 36.0–46.0)
Hemoglobin: 13.2 g/dL (ref 12.0–15.0)
MCH: 29.5 pg (ref 26.0–34.0)
MCHC: 34.1 g/dL (ref 30.0–36.0)
MCV: 86.4 fL (ref 78.0–100.0)
Platelets: 235 10*3/uL (ref 150–400)
RBC: 4.48 MIL/uL (ref 3.87–5.11)
RDW: 14.4 % (ref 11.5–15.5)
WBC: 13.3 10*3/uL — AB (ref 4.0–10.5)

## 2014-10-09 LAB — TYPE AND SCREEN
ABO/RH(D): A NEG
Antibody Screen: NEGATIVE

## 2014-10-09 MED ORDER — BUPIVACAINE HCL (PF) 0.25 % IJ SOLN
INTRAMUSCULAR | Status: DC | PRN
  Administered 2014-10-09 (×2): 4 mL

## 2014-10-09 MED ORDER — FENTANYL 2.5 MCG/ML BUPIVACAINE 1/10 % EPIDURAL INFUSION (WH - ANES)
14.0000 mL/h | INTRAMUSCULAR | Status: DC | PRN
  Administered 2014-10-09 (×2): 14 mL/h via EPIDURAL
  Filled 2014-10-09: qty 125

## 2014-10-09 MED ORDER — ACETAMINOPHEN 325 MG PO TABS: 650.0000 mg | ORAL_TABLET | ORAL | Status: DC | PRN

## 2014-10-09 MED ORDER — LIDOCAINE-EPINEPHRINE (PF) 2 %-1:200000 IJ SOLN
INTRAMUSCULAR | Status: DC | PRN
  Administered 2014-10-09: 3 mL

## 2014-10-09 MED ORDER — FLEET ENEMA 7-19 GM/118ML RE ENEM: 1.0000 | ENEMA | RECTAL | Status: DC | PRN

## 2014-10-09 MED ORDER — EPHEDRINE 5 MG/ML INJ
10.0000 mg | INTRAVENOUS | Status: DC | PRN
Start: 2014-10-09 — End: 2014-10-10
  Filled 2014-10-09: qty 2

## 2014-10-09 MED ORDER — CITRIC ACID-SODIUM CITRATE 334-500 MG/5ML PO SOLN: 30.0000 mL | ORAL | Status: DC | PRN

## 2014-10-09 MED ORDER — PENICILLIN G POTASSIUM 5000000 UNITS IJ SOLR: 2.5000 10*6.[IU] | INTRAVENOUS | Status: DC

## 2014-10-09 MED ORDER — OXYCODONE-ACETAMINOPHEN 5-325 MG PO TABS: 1.0000 | ORAL_TABLET | ORAL | Status: DC | PRN

## 2014-10-09 MED ORDER — LACTATED RINGERS IV SOLN: INTRAVENOUS | Status: DC

## 2014-10-09 MED ORDER — OXYCODONE-ACETAMINOPHEN 5-325 MG PO TABS: 2.0000 | ORAL_TABLET | ORAL | Status: DC | PRN

## 2014-10-09 MED ORDER — PHENYLEPHRINE 40 MCG/ML (10ML) SYRINGE FOR IV PUSH (FOR BLOOD PRESSURE SUPPORT)
80.0000 ug | PREFILLED_SYRINGE | INTRAVENOUS | Status: DC | PRN
Start: 2014-10-09 — End: 2014-10-10
  Filled 2014-10-09: qty 2
  Filled 2014-10-09: qty 20

## 2014-10-09 MED ORDER — LIDOCAINE HCL (PF) 1 % IJ SOLN
30.0000 mL | INTRAMUSCULAR | Status: DC | PRN
  Filled 2014-10-09: qty 30

## 2014-10-09 MED ORDER — PENICILLIN G POTASSIUM 5000000 UNITS IJ SOLR: 5.0000 10*6.[IU] | Freq: Once | INTRAVENOUS | Status: DC

## 2014-10-09 MED ORDER — LABETALOL HCL 200 MG PO TABS: 200.0000 mg | ORAL_TABLET | Freq: Two times a day (BID) | ORAL | Status: DC

## 2014-10-09 MED ORDER — PENICILLIN G POTASSIUM 5000000 UNITS IJ SOLR
5.0000 10*6.[IU] | Freq: Once | INTRAVENOUS | Status: AC
  Administered 2014-10-09: 5 10*6.[IU] via INTRAVENOUS
  Filled 2014-10-09: qty 5

## 2014-10-09 MED ORDER — LACTATED RINGERS IV SOLN: 500.0000 mL | INTRAVENOUS | Status: DC | PRN

## 2014-10-09 MED ORDER — OXYTOCIN BOLUS FROM INFUSION: 500.0000 mL | INTRAVENOUS | Status: DC

## 2014-10-09 MED ORDER — ONDANSETRON HCL 4 MG/2ML IJ SOLN: 4.0000 mg | Freq: Four times a day (QID) | INTRAMUSCULAR | Status: DC | PRN

## 2014-10-09 MED ORDER — LIDOCAINE HCL (PF) 1 % IJ SOLN: 30.0000 mL | INTRAMUSCULAR | Status: DC | PRN

## 2014-10-09 MED ORDER — LACTATED RINGERS IV SOLN
INTRAVENOUS | Status: DC
  Administered 2014-10-09: 21:00:00 via INTRAVENOUS

## 2014-10-09 MED ORDER — OXYTOCIN BOLUS FROM INFUSION
500.0000 mL | INTRAVENOUS | Status: DC
  Administered 2014-10-10: 500 mL via INTRAVENOUS

## 2014-10-09 MED ORDER — OXYTOCIN 40 UNITS IN LACTATED RINGERS INFUSION - SIMPLE MED
62.5000 mL/h | INTRAVENOUS | Status: DC
  Filled 2014-10-09: qty 1000

## 2014-10-09 MED ORDER — OXYTOCIN 40 UNITS IN LACTATED RINGERS INFUSION - SIMPLE MED: 62.5000 mL/h | INTRAVENOUS | Status: DC

## 2014-10-09 MED ORDER — DIPHENHYDRAMINE HCL 50 MG/ML IJ SOLN: 12.5000 mg | INTRAMUSCULAR | Status: DC | PRN

## 2014-10-09 MED ORDER — PENICILLIN G POTASSIUM 5000000 UNITS IJ SOLR
2.5000 10*6.[IU] | INTRAMUSCULAR | Status: DC
  Administered 2014-10-10: 2.5 10*6.[IU] via INTRAVENOUS
  Filled 2014-10-09 (×5): qty 2.5

## 2014-10-09 NOTE — MAU Note (Signed)
Contractions every 6 minutes. Denies bright red vaginal bleeding.  States Positive bloody show.  Positive fetal movement Denies SROM/LOF Denies any infections/complications of pregnancy  GBS positive per patient  Plans for epidural during labor

## 2014-10-09 NOTE — H&P (Addendum)
Elizabeth [name] is a 29 y.o. female [id number], at 39.1 wks presenting in active labor with UCs bit no loss of fluid. Notes good FMs.  [name]. GBS(+) urine, needs PCN in labor. Rh negative, S/p Rhogam in 1st trim for bleeding and again at 28 wks. 30 lbs wt gain. No other concerns.  History OB History    Gravida Para Term Preterm AB TAB SAB Ectopic Multiple Living   4 2 2  1  1   2      Past Medical History  Diagnosis Date  . No pertinent past medical history   . Medical history non-contributory    Past Surgical History  Procedure Laterality Date  . No past surgeries     Family History: family history is not on file. Social History:  reports that she has never smoked. She has never used smokeless tobacco. She reports that she does not drink alcohol or use illicit drugs.   Prenatal Transfer Tool  Maternal Diabetes: No Genetic Screening: Declined Maternal Ultrasounds/Referrals: Normal Fetal Ultrasounds or other Referrals:  None Maternal Substance Abuse:  No Significant Maternal Medications:  None Significant Maternal Lab Results:  Lab values include: Group B Strep positive Rh negative, Rhogam 1st trim for bleeding, and at 28 wks.  Other Comments:  None  ROS  Neg   Dilation: 5.5 Effacement (%): 90 Station: -2 Exam by:: Elizabeth KehrNicole Dunbar RN  Height 5\' 9"  (1.753 m), weight 183 lb 12.8 oz (83.371 kg), unknown if currently breastfeeding. Exam Physical Exam  A&O x 3, no acute distress. Pleasant HEENT neg, no thyromegaly Lungs CTA bilat CV RRR, S1S2 normal Abdo soft, non tender, non acute Extr no edema/ tenderness Pelvic per RN FHT  130s/ + accels/ no decels/ mod variab- reassuring category I Toco q 3 min, spontaneous labor   Prenatal labs: ABO, Rh: A/Negative/-- (12/03 0000) Antibody: Negative (12/03 0000) Rubella: Immune (12/03 0000) RPR: Nonreactive (12/03 0000)  HBsAg: Negative (12/03 0000)  HIV: Non-reactive (12/03 0000)  GBS: Positive (06/13 0000)  Pap  normal  Glucose screen normal Anatomy sono normal Declined Quad and AFP screen   Assessment/Plan: 29 yo G4P2012, 39.1 wks, active labor. GBS prophylaxis with PCN per protocol, epidural ASAP, expectant management until 2nd dose of PCN. EFW 7 lbs. Anticipate SVD.   Elizabeth [name] R 10/09/2014, 8:44 PM

## 2014-10-09 NOTE — MAU Note (Signed)
[facility] Pt may come to [facility]

## 2014-10-09 NOTE — Anesthesia Preprocedure Evaluation (Signed)

## 2014-10-09 NOTE — Anesthesia Procedure Notes (Signed)
Epidural Patient location during procedure: OB  Staffing Anesthesiologist: Parth Mccormac, CHRIS Performed by: anesthesiologist   Preanesthetic Checklist Completed: patient identified, surgical consent, pre-op evaluation, timeout performed, IV checked, risks and benefits discussed and monitors and equipment checked  Epidural Patient position: sitting Prep: site prepped and draped and DuraPrep Patient monitoring: heart rate, cardiac monitor, continuous pulse ox and blood pressure Approach: midline Location: L4-L5 Injection technique: LOR saline  Needle:  Needle type: Tuohy  Needle gauge: 17 G Needle length: 9 cm Needle insertion depth: 6 cm Catheter type: closed end flexible Catheter size: 19 Gauge Catheter at skin depth: 12 cm Test dose: 2% lidocaine with Epi 1:200 K and negative  Assessment Events: blood not aspirated, injection not painful, no injection resistance, negative IV test and no paresthesia  Additional Notes  Initial catheter placed intravascular noted on aspiration. Pulled and replaced at same level with negative aspiration and test dose  H+P and labs checked, risks and benefits discussed with the patient, consent obtained, procedure tolerated well and without complications.Reason for block:procedure for pain

## 2014-10-10 ENCOUNTER — Encounter (HOSPITAL_COMMUNITY): Payer: Self-pay | Admitting: *Deleted

## 2014-10-10 LAB — CBC
HCT: 35.1 % — ABNORMAL LOW (ref 36.0–46.0)
Hemoglobin: 11.7 g/dL — ABNORMAL LOW (ref 12.0–15.0)
MCH: 29.1 pg (ref 26.0–34.0)
MCHC: 33.3 g/dL (ref 30.0–36.0)
MCV: 87.3 fL (ref 78.0–100.0)
Platelets: 200 10*3/uL (ref 150–400)
RBC: 4.02 MIL/uL (ref 3.87–5.11)
RDW: 14.2 % (ref 11.5–15.5)
WBC: 17.5 10*3/uL — AB (ref 4.0–10.5)

## 2014-10-10 LAB — RPR: RPR: NONREACTIVE

## 2014-10-10 MED ORDER — ONDANSETRON HCL 4 MG/2ML IJ SOLN: 4.0000 mg | INTRAMUSCULAR | Status: DC | PRN

## 2014-10-10 MED ORDER — DIBUCAINE 1 % RE OINT: 1.0000 "application " | TOPICAL_OINTMENT | RECTAL | Status: DC | PRN

## 2014-10-10 MED ORDER — SIMETHICONE 80 MG PO CHEW: 80.0000 mg | CHEWABLE_TABLET | ORAL | Status: DC | PRN

## 2014-10-10 MED ORDER — IBUPROFEN 600 MG PO TABS
600.0000 mg | ORAL_TABLET | Freq: Four times a day (QID) | ORAL | Status: DC
  Administered 2014-10-10 – 2014-10-11 (×6): 600 mg via ORAL
  Filled 2014-10-10 (×6): qty 1

## 2014-10-10 MED ORDER — ZOLPIDEM TARTRATE 5 MG PO TABS: 5.0000 mg | ORAL_TABLET | Freq: Every evening | ORAL | Status: DC | PRN

## 2014-10-10 MED ORDER — PRENATAL MULTIVITAMIN CH
1.0000 | ORAL_TABLET | Freq: Every day | ORAL | Status: DC
  Administered 2014-10-10 – 2014-10-11 (×2): 1 via ORAL
  Filled 2014-10-10 (×2): qty 1

## 2014-10-10 MED ORDER — OXYCODONE-ACETAMINOPHEN 5-325 MG PO TABS
2.0000 | ORAL_TABLET | ORAL | Status: DC | PRN
Start: 2014-10-10 — End: 2014-10-11

## 2014-10-10 MED ORDER — LANOLIN HYDROUS EX OINT: TOPICAL_OINTMENT | CUTANEOUS | Status: DC | PRN

## 2014-10-10 MED ORDER — OXYCODONE-ACETAMINOPHEN 5-325 MG PO TABS: 1.0000 | ORAL_TABLET | ORAL | Status: DC | PRN

## 2014-10-10 MED ORDER — ONDANSETRON HCL 4 MG PO TABS: 4.0000 mg | ORAL_TABLET | ORAL | Status: DC | PRN

## 2014-10-10 MED ORDER — TETANUS-DIPHTH-ACELL PERTUSSIS 5-2.5-18.5 LF-MCG/0.5 IM SUSP: 0.5000 mL | Freq: Once | INTRAMUSCULAR | Status: DC

## 2014-10-10 MED ORDER — ACETAMINOPHEN 325 MG PO TABS: 650.0000 mg | ORAL_TABLET | ORAL | Status: DC | PRN

## 2014-10-10 MED ORDER — SENNOSIDES-DOCUSATE SODIUM 8.6-50 MG PO TABS
2.0000 | ORAL_TABLET | ORAL | Status: DC
  Administered 2014-10-10: 2 via ORAL
  Filled 2014-10-10: qty 2

## 2014-10-10 MED ORDER — WITCH HAZEL-GLYCERIN EX PADS: 1.0000 "application " | MEDICATED_PAD | CUTANEOUS | Status: DC | PRN

## 2014-10-10 MED ORDER — DIPHENHYDRAMINE HCL 25 MG PO CAPS: 25.0000 mg | ORAL_CAPSULE | Freq: Four times a day (QID) | ORAL | Status: DC | PRN

## 2014-10-10 MED ORDER — BENZOCAINE-MENTHOL 20-0.5 % EX AERO
1.0000 "application " | INHALATION_SPRAY | CUTANEOUS | Status: DC | PRN
  Administered 2014-10-10: 1 via TOPICAL
  Filled 2014-10-10: qty 56

## 2014-10-10 NOTE — Lactation Note (Signed)
This note was copied from the chart of [name]. Lactation Consultation Note  Initial visit made.  Breastfeeding consultation services and support information given to patient.  This is mom's third baby and she breastfed her previous babies.  She states newborn is latching well.  Reviewed feeding with any feeding cues.  Encouraged to call for assist prn.  Patient Name: [name]: 10/10/2014 Reason for consult: Initial assessment   Maternal Data    Feeding    LATCH Score/Interventions                      Lactation Tools Discussed/Used     Consult Status Consult Status: Follow-up [name]: 10/11/14 Follow-up type: In-patient    Huston FoleyMOULDEN, Geralda Baumgardner S 10/10/2014, 1:58 PM

## 2014-10-10 NOTE — Anesthesia Postprocedure Evaluation (Signed)
  Anesthesia Post-op Note  Patient: Elizabeth [name]  Procedure(s) Performed: * No procedures listed *  Patient Location: Mother/Baby  Anesthesia Type:Epidural  Level of Consciousness: awake  Airway and Oxygen Therapy: Patient Spontanous Breathing  Post-op Pain: mild  Post-op Assessment: Patient's Cardiovascular Status Stable and Respiratory Function Stable              Post-op Vital Signs: stable  Last Vitals:  Filed Vitals:   10/10/14 1020  BP: 113/67  Pulse: 86  Temp: 36.8 C  Resp: 18    Complications: No apparent anesthesia complications

## 2014-10-10 NOTE — Progress Notes (Signed)
Interval Note:  S: Feels well, tired. Pain controlled w/Motrin.  O: VSS  A: S/p SVD  P: PPD #0     Continue routine postpartum care.     Consider early discharge tomorrow.

## 2014-10-11 DIAGNOSIS — Z6791 Unspecified blood type, Rh negative: Secondary | ICD-10-CM

## 2014-10-11 DIAGNOSIS — O26899 Other specified pregnancy related conditions, unspecified trimester: Secondary | ICD-10-CM | POA: Diagnosis present

## 2014-10-11 MED ORDER — IBUPROFEN 600 MG PO TABS: 600.0000 mg | ORAL_TABLET | Freq: Four times a day (QID) | ORAL | Status: DC

## 2014-10-11 NOTE — Lactation Note (Signed)
This note was copied from the chart of Elizabeth [name] Gamarra. Lactation Consultation Note' Experienced BF mom. Reports he fed well after delivery but has been sleepy though the night only feeding for 5 min at a time. Parents want to go home today.And baby is for circ this morning. Baby only took a few sucks on my gloved finger- took some time to get start sucking. Few sucks on the breast mostly just holding it in his mouth. Baby had stool- diaper changed. More alert- still would not latch. Encouraged lots of skin to skin and to offer the breast whenever she sees feeding cues. Reviewed normal behavior after circ No questions at present. To call prn  Patient Name: Elizabeth [name] Sidener [id number]'s Date: 10/11/2014 Reason for consult: Follow-up assessment   Maternal Data Formula Feeding for Exclusion: No Has patient been taught Hand Expression?: Yes Does the patient have breastfeeding experience prior to this delivery?: Yes  Feeding Feeding Type: Breast Fed Length of feed: 0 min  LATCH Score/Interventions Latch: Too sleepy or reluctant, no latch achieved, no sucking elicited. (few sucks)  Audible Swallowing: None Intervention(s): Skin to skin;Hand expression  Type of Nipple: Everted at rest and after stimulation  Comfort (Breast/Nipple): Soft / non-tender     Hold (Positioning): Assistance needed to correctly position infant at breast and maintain latch. Intervention(s): Breastfeeding basics reviewed  LATCH Score: 5  Lactation Tools Discussed/Used WIC Program: No   Consult Status Consult Status: Follow-up Date: 10/11/14 Follow-up type: In-patient    Pamelia HoitWeeks, Bobbie Valletta D 10/11/2014, 10:28 AM

## 2014-10-11 NOTE — Discharge Summary (Signed)
Obstetric Discharge Summary Reason for Admission: onset of labor and 39.[redacted] weeks gestation Prenatal Procedures: GBS bacteruria, Rh negative Intrapartum Procedures: spontaneous vaginal delivery and GBS prophylaxis Postpartum Procedures: none Complications-Operative and Postpartum: 2nd degree perineal laceration HEMOGLOBIN  Date Value Ref Range Status  10/10/2014 11.7* 12.0 - 15.0 g/dL Final   HCT  Date Value Ref Range Status  10/10/2014 35.1* 36.0 - 46.0 % Final    Physical Exam:  General: alert, cooperative and no distress Lochia: appropriate Uterine Fundus: firm Incision: healing well, no significant drainage, no dehiscence, no significant erythema DVT Evaluation: No evidence of DVT seen on physical exam. Negative Homan's sign. No cords or calf tenderness. No significant calf/ankle edema.  Discharge Diagnoses: Term Pregnancy-delivered  Discharge Information: Date: 10/11/2014 Activity: pelvic rest Diet: routine Medications: PNV and Ibuprofen Condition: stable Instructions: refer to practice specific booklet Discharge to: home Follow-up Information    Follow up with MODY,VAISHALI R, MD. Schedule an appointment as soon as possible for a visit in 6 weeks.   Specialty:  Obstetrics and Gynecology   Contact information:   [facility] [address] [place] [id number] [phone]       Newborn Data: Live born female on 10/10/14 Birth Weight: 8 lb 4.8 oz (3765 g) APGAR: 8, 9  Home with mother.  Cody Oliger, [place], N 10/11/2014, 11:46 AM

## 2014-10-11 NOTE — Progress Notes (Signed)
PPD #1- SVD  Subjective:   Reports feeling well, desires early discharge Tolerating po/ No nausea or vomiting Bleeding is light Pain controlled with Motrin Up ad lib / ambulatory / voiding without problems Newborn: breastfeeding  / Circumcision: done   Objective:   VS: VS:  Filed Vitals:   10/10/14 0654 10/10/14 1020 10/10/14 1800 10/11/14 0608  BP: 111/55 113/67 116/67 115/69  Pulse: 87 86 75 76  Temp: 98.8 F (37.1 C) 98.2 F (36.8 C) 98.3 F (36.8 C) 98 F (36.7 C)  TempSrc: Oral Oral Oral Oral  Resp: 16 18 21 18   Height:      Weight:      SpO2:  99% 98%     LABS:  Recent Labs  10/09/14 2030 10/10/14 0700  WBC 13.3* 17.5*  HGB 13.2 11.7*  PLT 235 200   Blood type: --/--/A NEG (07/01 2030) Rubella: Immune (12/03 0000)                I&O: Intake/Output      07/02 0701 - 07/03 0700 07/03 0701 - 07/04 0700   Urine (mL/kg/hr)     Blood     Total Output       Net              Physical Exam: Alert and oriented X3 Abdomen: soft, non-tender, non-distended  Fundus: firm, non-tender, U-2 Perineum: Well approximated, no significant erythema, edema, or drainage; healing well. Lochia: small Extremities: no edema, no calf pain or tenderness    Assessment: PPD #1  G4P3013/ S/P:spontaneous vaginal, 2nd degree laceration Rh negative-baby Rh neg Doing well - stable for discharge home   Plan: Discharge home RX's:  Ibuprofen 600mg  po Q 6 hrs prn pain #30 Refill x 0 [facility] given    Donette LarryBHAMBRI, Analysa, N MSN, CNM 10/11/2014, 11:15 AM

## 2015-05-02 ENCOUNTER — Encounter (HOSPITAL_COMMUNITY): Payer: Self-pay | Admitting: Emergency Medicine

## 2015-05-02 ENCOUNTER — Emergency Department (INDEPENDENT_AMBULATORY_CARE_PROVIDER_SITE_OTHER)
Admission: EM | Admit: 2015-05-02 | Discharge: 2015-05-02 | Disposition: A | Discharge status: Home / Self Care | Payer: No Typology Code available for payment source | Source: Home / Self Care | Attending: Family Medicine | Admitting: Family Medicine

## 2015-05-02 DIAGNOSIS — J111 Influenza due to unidentified influenza virus with other respiratory manifestations: Secondary | ICD-10-CM | POA: Diagnosis not present

## 2015-05-02 MED ORDER — ONDANSETRON HCL 4 MG PO TABS: 4.0000 mg | ORAL_TABLET | Freq: Four times a day (QID) | ORAL | Status: DC

## 2015-05-02 MED ORDER — OSELTAMIVIR PHOSPHATE 75 MG PO CAPS: 75.0000 mg | ORAL_CAPSULE | Freq: Two times a day (BID) | ORAL | Status: DC

## 2015-05-02 NOTE — Discharge Instructions (Signed)
Influenza, Adult Influenza (flu) is an infection in the mouth, nose, and throat (respiratory tract) caused by a virus. The flu can make you feel very ill. Influenza spreads easily from person to person (contagious).  HOME CARE   Only take medicines as told by your doctor.  Use a cool mist humidifier to make breathing easier.  Get plenty of rest until your fever goes away. This usually takes 3 to 4 days.  Drink enough fluids to keep your pee (urine) clear or pale yellow.  Cover your mouth and nose when you cough or sneeze.  Wash your hands well to avoid spreading the flu.  Stay home from work or school until your fever has been gone for at least 1 full day.  Get a flu shot every year. GET HELP RIGHT AWAY IF:   You have trouble breathing or feel short of breath.  Your skin or nails turn blue.  You have severe neck pain or stiffness.  You have a severe headache, facial pain, or earache.  Your fever gets worse or keeps coming back.  You feel sick to your stomach (nauseous), throw up (vomit), or have watery poop (diarrhea).  You have chest pain.  You have a deep cough that gets worse, or you cough up more thick spit (mucus). MAKE SURE YOU:   Understand these instructions.  Will watch your condition.  Will get help right away if you are not doing well or get worse.   This information is not intended to replace advice given to you by your health care provider. Make sure you discuss any questions you have with your health care provider.   Document Released: 01/04/2008 Document Revised: 04/17/2014 Document Reviewed: 06/26/2011 Elsevier Interactive Patient Education 2016 Elsevier Inc.  

## 2015-05-02 NOTE — ED Provider Notes (Signed)
CSN: [id number]     Arrival date & time 05/02/15  1830 History   First MD Initiated Contact with Patient 05/02/15 1917     Chief Complaint  Patient presents with  . Influenza   (Consider location/radiation/quality/duration/timing/severity/associated sxs/prior Treatment) HPI Flu like symptoms since yesterday. Fever, chills, body aches, nausea.  Past Medical History  Diagnosis Date  . No pertinent past medical history   . Medical history non-contributory    Past Surgical History  Procedure Laterality Date  . No past surgeries     No family history on file. Social History  Substance Use Topics  . Smoking status: Never Smoker   . Smokeless tobacco: Never Used  . Alcohol Use: No   OB History    Gravida Para Term Preterm AB TAB SAB Ectopic Multiple Living   0 3     Review of Systems ROS +'ve flu symptoms  Denies: HEADACHE, NAUSEA, ABDOMINAL PAIN, CHEST PAIN, CONGESTION, DYSURIA, SHORTNESS OF BREATH  Allergies  Neomycin  Home Medications   Prior to Admission medications   Medication Sig Start Date End Date Taking? Authorizing Provider  ibuprofen (ADVIL,MOTRIN) 600 MG tablet Take 1 tablet (600 mg total) by mouth every 6 (six) hours. 10/11/14   Lawernce Pitts, CNM  ondansetron (ZOFRAN) 4 MG tablet Take 1 tablet (4 mg total) by mouth every 6 (six) hours. 05/02/15   Tharon Aquas, PA  oseltamivir (TAMIFLU) 75 MG capsule Take 1 capsule (75 mg total) by mouth every 12 (twelve) hours. 05/02/15   Tharon Aquas, PA  Prenatal Vit-Fe Fumarate-FA (PRENATAL MULTIVITAMIN) TABS tablet Take 1 tablet by mouth at bedtime.    Historical Provider, MD   Meds Ordered and Administered this Visit  Medications - No data to display  BP 121/78 mmHg  Pulse 101  Temp(Src) 99.2 F (37.3 C) (Oral)  Resp 18  SpO2 98%  LMP 04/15/2015 No data found.   Physical Exam  Constitutional: She is oriented to person, place, and time. She appears well-developed and well-nourished. No  distress.  HENT:  Head: Normocephalic and atraumatic.  Right Ear: External ear normal.  Left Ear: External ear normal.  Nose: Nose normal.  Mouth/Throat: Oropharynx is clear and moist. No oropharyngeal exudate.  Eyes: Conjunctivae are normal.  Cardiovascular: Normal rate.   Pulmonary/Chest: Effort normal and breath sounds normal.  Musculoskeletal: Normal range of motion.  Neurological: She is alert and oriented to person, place, and time.  Skin: Skin is warm and dry.  Psychiatric: She has a normal mood and affect. Her behavior is normal. Judgment and thought content normal.  Nursing note and vitals reviewed.   ED Course  Procedures (including critical care time)  Labs Review Labs Reviewed - No data to display  Imaging Review No results found.   Visual Acuity Review  Right Eye Distance:   Left Eye Distance:   Bilateral Distance:    Right Eye Near:   Left Eye Near:    Bilateral Near:         MDM   1. Influenza    [facility] and zofran safe for  Breast feeding.   Continue symptomatic treatment    Tharon Aquas, [place] 05/02/15 1946

## 2015-05-02 NOTE — ED Notes (Signed)
Here with flu-like sx's nausea that started last night with generalized aches and fever Denies cough, vomiting, diarrhea  Taking Tylenol OTC Temp 99.2

## 2016-01-31 DIAGNOSIS — B351 Tinea unguium: Secondary | ICD-10-CM | POA: Insufficient documentation

## 2017-12-24 DIAGNOSIS — E042 Nontoxic multinodular goiter: Secondary | ICD-10-CM | POA: Insufficient documentation

## 2017-12-24 DIAGNOSIS — R002 Palpitations: Secondary | ICD-10-CM | POA: Insufficient documentation

## 2019-04-11 NOTE — L&D Delivery Note (Signed)
Delivery Note At 3:04 AM a viable and healthy female was delivered via Vaginal, Spontaneous (Presentation: Left Occiput Anterior).  APGAR: 9, 9; weight pending  .   Placenta status: Spontaneous, Intact.  Cord: 3 vessels with the following complications: None.  Cord pH: NA  Anesthesia: Epidural Episiotomy: None Lacerations: 1st degree;Perineal Suture Repair: 3.0 vicryl rapide Est. Blood Loss (mL): 123  Mom to postpartum.  Baby to Couplet care / Skin to Skin.  Robley Fries 11/05/2019, 3:50 AM

## 2019-04-15 LAB — OB RESULTS CONSOLE RPR: RPR: NONREACTIVE

## 2019-04-15 LAB — OB RESULTS CONSOLE ABO/RH: RH Type: NEGATIVE

## 2019-04-15 LAB — OB RESULTS CONSOLE HEPATITIS B SURFACE ANTIGEN: Hepatitis B Surface Ag: NEGATIVE

## 2019-04-15 LAB — OB RESULTS CONSOLE HIV ANTIBODY (ROUTINE TESTING): HIV: NONREACTIVE

## 2019-04-15 LAB — OB RESULTS CONSOLE GC/CHLAMYDIA
Chlamydia: NEGATIVE
Gonorrhea: NEGATIVE

## 2019-04-15 LAB — OB RESULTS CONSOLE RUBELLA ANTIBODY, IGM: Rubella: IMMUNE

## 2019-10-14 LAB — OB RESULTS CONSOLE GBS: GBS: POSITIVE

## 2019-11-05 ENCOUNTER — Inpatient Hospital Stay (HOSPITAL_COMMUNITY): Payer: Self-pay | Admitting: Anesthesiology

## 2019-11-05 ENCOUNTER — Encounter (HOSPITAL_COMMUNITY): Payer: Self-pay

## 2019-11-05 ENCOUNTER — Inpatient Hospital Stay (HOSPITAL_COMMUNITY)
Admission: AD | Admit: 2019-11-05 | Discharge: 2019-11-07 | DRG: 806 | Disposition: A | Discharge status: Home / Self Care | Payer: Self-pay | Attending: Obstetrics & Gynecology | Admitting: Obstetrics & Gynecology

## 2019-11-05 ENCOUNTER — Other Ambulatory Visit: Payer: Self-pay

## 2019-11-05 DIAGNOSIS — D62 Acute posthemorrhagic anemia: Secondary | ICD-10-CM | POA: Diagnosis not present

## 2019-11-05 DIAGNOSIS — Z3A39 39 weeks gestation of pregnancy: Secondary | ICD-10-CM

## 2019-11-05 DIAGNOSIS — Z20822 Contact with and (suspected) exposure to covid-19: Secondary | ICD-10-CM | POA: Diagnosis present

## 2019-11-05 DIAGNOSIS — O9081 Anemia of the puerperium: Secondary | ICD-10-CM | POA: Diagnosis not present

## 2019-11-05 DIAGNOSIS — O99824 Streptococcus B carrier state complicating childbirth: Principal | ICD-10-CM | POA: Diagnosis present

## 2019-11-05 DIAGNOSIS — Z6791 Unspecified blood type, Rh negative: Secondary | ICD-10-CM

## 2019-11-05 DIAGNOSIS — O26893 Other specified pregnancy related conditions, third trimester: Secondary | ICD-10-CM | POA: Diagnosis present

## 2019-11-05 LAB — CBC
HCT: 37.8 % (ref 36.0–46.0)
HCT: 33.4 % — ABNORMAL LOW (ref 36.0–46.0)
Hemoglobin: 11.5 g/dL — ABNORMAL LOW (ref 12.0–15.0)
Hemoglobin: 10.2 g/dL — ABNORMAL LOW (ref 12.0–15.0)
MCH: 25.2 pg — ABNORMAL LOW (ref 26.0–34.0)
MCH: 24.9 pg — ABNORMAL LOW (ref 26.0–34.0)
MCHC: 30.4 g/dL (ref 30.0–36.0)
MCHC: 30.5 g/dL (ref 30.0–36.0)
MCV: 82.7 fL (ref 80.0–100.0)
MCV: 81.7 fL (ref 80.0–100.0)
Platelets: 309 10*3/uL (ref 150–400)
Platelets: 240 10*3/uL (ref 150–400)
RBC: 4.57 MIL/uL (ref 3.87–5.11)
RBC: 4.09 MIL/uL (ref 3.87–5.11)
RDW: 14.5 % (ref 11.5–15.5)
RDW: 14.4 % (ref 11.5–15.5)
WBC: 10 10*3/uL (ref 4.0–10.5)
WBC: 15.2 10*3/uL — ABNORMAL HIGH (ref 4.0–10.5)
nRBC: 0 % (ref 0.0–0.2)
nRBC: 0 % (ref 0.0–0.2)

## 2019-11-05 LAB — TYPE AND SCREEN
ABO/RH(D): A NEG
Antibody Screen: POSITIVE

## 2019-11-05 LAB — RPR: RPR Ser Ql: NONREACTIVE

## 2019-11-05 LAB — SARS CORONAVIRUS 2 BY RT PCR (HOSPITAL ORDER, PERFORMED IN ~~LOC~~ HOSPITAL LAB): SARS Coronavirus 2: NEGATIVE

## 2019-11-05 MED ORDER — ACETAMINOPHEN 325 MG PO TABS: 650.0000 mg | ORAL_TABLET | ORAL | Status: DC | PRN

## 2019-11-05 MED ORDER — SOD CITRATE-CITRIC ACID 500-334 MG/5ML PO SOLN: 30.0000 mL | ORAL | Status: DC | PRN

## 2019-11-05 MED ORDER — BUPIVACAINE HCL (PF) 0.25 % IJ SOLN
INTRAMUSCULAR | Status: DC | PRN
  Administered 2019-11-05: 5 mL via EPIDURAL

## 2019-11-05 MED ORDER — EPHEDRINE 5 MG/ML INJ: 10.0000 mg | INTRAVENOUS | Status: DC | PRN

## 2019-11-05 MED ORDER — LACTATED RINGERS IV SOLN: 500.0000 mL | INTRAVENOUS | Status: DC | PRN

## 2019-11-05 MED ORDER — FLEET ENEMA 7-19 GM/118ML RE ENEM: 1.0000 | ENEMA | RECTAL | Status: DC | PRN

## 2019-11-05 MED ORDER — OXYCODONE-ACETAMINOPHEN 5-325 MG PO TABS: 1.0000 | ORAL_TABLET | ORAL | Status: DC | PRN

## 2019-11-05 MED ORDER — PRENATAL MULTIVITAMIN CH
1.0000 | ORAL_TABLET | Freq: Every day | ORAL | Status: DC
  Administered 2019-11-05 – 2019-11-06 (×2): 1 via ORAL
  Filled 2019-11-05 (×2): qty 1

## 2019-11-05 MED ORDER — PHENYLEPHRINE 40 MCG/ML (10ML) SYRINGE FOR IV PUSH (FOR BLOOD PRESSURE SUPPORT): 80.0000 ug | PREFILLED_SYRINGE | INTRAVENOUS | Status: DC | PRN

## 2019-11-05 MED ORDER — DIPHENHYDRAMINE HCL 50 MG/ML IJ SOLN: 12.5000 mg | INTRAMUSCULAR | Status: DC | PRN

## 2019-11-05 MED ORDER — SIMETHICONE 80 MG PO CHEW: 80.0000 mg | CHEWABLE_TABLET | ORAL | Status: DC | PRN

## 2019-11-05 MED ORDER — DIBUCAINE (PERIANAL) 1 % EX OINT: 1.0000 "application " | TOPICAL_OINTMENT | CUTANEOUS | Status: DC | PRN

## 2019-11-05 MED ORDER — OXYCODONE-ACETAMINOPHEN 5-325 MG PO TABS: 2.0000 | ORAL_TABLET | ORAL | Status: DC | PRN

## 2019-11-05 MED ORDER — SENNOSIDES-DOCUSATE SODIUM 8.6-50 MG PO TABS
2.0000 | ORAL_TABLET | ORAL | Status: DC
  Administered 2019-11-05 – 2019-11-06 (×2): 2 via ORAL
  Filled 2019-11-05 (×2): qty 2

## 2019-11-05 MED ORDER — DIPHENHYDRAMINE HCL 25 MG PO CAPS: 25.0000 mg | ORAL_CAPSULE | Freq: Four times a day (QID) | ORAL | Status: DC | PRN

## 2019-11-05 MED ORDER — ONDANSETRON HCL 4 MG PO TABS: 4.0000 mg | ORAL_TABLET | ORAL | Status: DC | PRN

## 2019-11-05 MED ORDER — IBUPROFEN 600 MG PO TABS
600.0000 mg | ORAL_TABLET | Freq: Four times a day (QID) | ORAL | Status: DC
  Administered 2019-11-05 – 2019-11-07 (×9): 600 mg via ORAL
  Filled 2019-11-05 (×9): qty 1

## 2019-11-05 MED ORDER — FENTANYL CITRATE (PF) 100 MCG/2ML IJ SOLN
INTRAMUSCULAR | Status: AC
  Filled 2019-11-05: qty 2

## 2019-11-05 MED ORDER — ONDANSETRON HCL 4 MG/2ML IJ SOLN: 4.0000 mg | INTRAMUSCULAR | Status: DC | PRN

## 2019-11-05 MED ORDER — LIDOCAINE HCL (PF) 1 % IJ SOLN
INTRAMUSCULAR | Status: DC | PRN
  Administered 2019-11-05: 10 mL via EPIDURAL
  Administered 2019-11-05: 2 mL via EPIDURAL

## 2019-11-05 MED ORDER — OXYTOCIN BOLUS FROM INFUSION: 333.0000 mL | Freq: Once | INTRAVENOUS | Status: DC

## 2019-11-05 MED ORDER — LACTATED RINGERS IV SOLN: INTRAVENOUS | Status: DC

## 2019-11-05 MED ORDER — SODIUM CHLORIDE 0.9 % IV SOLN
2.0000 g | Freq: Once | INTRAVENOUS | Status: AC
  Administered 2019-11-05: 2 g via INTRAVENOUS
  Filled 2019-11-05: qty 2000

## 2019-11-05 MED ORDER — COCONUT OIL OIL: 1.0000 "application " | TOPICAL_OIL | Status: DC | PRN

## 2019-11-05 MED ORDER — LIDOCAINE HCL (PF) 1 % IJ SOLN: 30.0000 mL | INTRAMUSCULAR | Status: DC | PRN

## 2019-11-05 MED ORDER — ZOLPIDEM TARTRATE 5 MG PO TABS: 5.0000 mg | ORAL_TABLET | Freq: Every evening | ORAL | Status: DC | PRN

## 2019-11-05 MED ORDER — WITCH HAZEL-GLYCERIN EX PADS: 1.0000 "application " | MEDICATED_PAD | CUTANEOUS | Status: DC | PRN

## 2019-11-05 MED ORDER — FENTANYL-BUPIVACAINE-NACL 0.5-0.125-0.9 MG/250ML-% EP SOLN
EPIDURAL | Status: AC
  Filled 2019-11-05: qty 250

## 2019-11-05 MED ORDER — LACTATED RINGERS IV SOLN: 500.0000 mL | Freq: Once | INTRAVENOUS | Status: DC

## 2019-11-05 MED ORDER — OXYTOCIN-SODIUM CHLORIDE 30-0.9 UT/500ML-% IV SOLN
2.5000 [IU]/h | INTRAVENOUS | Status: DC
  Filled 2019-11-05: qty 500

## 2019-11-05 MED ORDER — BENZOCAINE-MENTHOL 20-0.5 % EX AERO: 1.0000 "application " | INHALATION_SPRAY | CUTANEOUS | Status: DC | PRN

## 2019-11-05 MED ORDER — FENTANYL-BUPIVACAINE-NACL 0.5-0.125-0.9 MG/250ML-% EP SOLN: 12.0000 mL/h | EPIDURAL | Status: DC | PRN

## 2019-11-05 MED ORDER — TETANUS-DIPHTH-ACELL PERTUSSIS 5-2.5-18.5 LF-MCG/0.5 IM SUSP: 0.5000 mL | Freq: Once | INTRAMUSCULAR | Status: DC

## 2019-11-05 MED ORDER — FENTANYL CITRATE (PF) 100 MCG/2ML IJ SOLN
INTRAMUSCULAR | Status: DC | PRN
  Administered 2019-11-05: 100 ug via EPIDURAL

## 2019-11-05 MED ORDER — ONDANSETRON HCL 4 MG/2ML IJ SOLN: 4.0000 mg | Freq: Four times a day (QID) | INTRAMUSCULAR | Status: DC | PRN

## 2019-11-05 NOTE — Anesthesia Preprocedure Evaluation (Signed)
Anesthesia Evaluation  Patient identified by MRN, date of birth, ID band Patient awake    Reviewed: Allergy & Precautions, Patient's Chart, lab work & pertinent test results  Airway Mallampati: II  TM Distance: >3 FB Neck ROM: Full    Dental no notable dental hx.    Pulmonary neg pulmonary ROS,    Pulmonary exam normal breath sounds clear to auscultation       Cardiovascular negative cardio ROS Normal cardiovascular exam Rhythm:Regular Rate:Normal     Neuro/Psych negative neurological ROS  negative psych ROS   GI/Hepatic negative GI ROS, Neg liver ROS,   Endo/Other  negative endocrine ROS  Renal/GU negative Renal ROS  negative genitourinary   Musculoskeletal negative musculoskeletal ROS (+)   Abdominal   Peds negative pediatric ROS (+)  Hematology negative hematology ROS (+) plt 309   Anesthesia Other Findings   Reproductive/Obstetrics (+) Pregnancy                             Anesthesia Physical Anesthesia Plan  ASA: II and emergent  Anesthesia Plan: Epidural   Post-op Pain Management:    Induction:   PONV Risk Score and Plan: 2  Airway Management Planned: Natural Airway  Additional Equipment: None  Intra-op Plan:   Post-operative Plan:   Informed Consent: I have reviewed the patients History and Physical, chart, labs and discussed the procedure including the risks, benefits and alternatives for the proposed anesthesia with the patient or authorized representative who has indicated his/her understanding and acceptance.       Plan Discussed with:   Anesthesia Plan Comments:         Anesthesia Quick Evaluation

## 2019-11-05 NOTE — MAU Note (Signed)
Pt here with reports of contractions all day, but worse over the last few hours. Reports they are now every 3-5 mins. Was seen in the office today and was 3cm. Reports she now has some bloody show. No LOF. Good fetal movement.

## 2019-11-05 NOTE — Anesthesia Procedure Notes (Signed)
Epidural Patient location during procedure: OB Start time: 11/05/2019 2:39 AM End time: 11/05/2019 2:55 AM  Staffing Anesthesiologist: Lannie Fields, DO Performed: anesthesiologist   Preanesthetic Checklist Completed: patient identified, IV checked, risks and benefits discussed, monitors and equipment checked, pre-op evaluation and timeout performed  Epidural Patient position: sitting Prep: DuraPrep and site prepped and draped Patient monitoring: continuous pulse ox, blood pressure, heart rate and cardiac monitor Approach: midline Location: L3-L4 Injection technique: LOR air  Needle:  Needle type: Tuohy  Needle gauge: 17 G Needle length: 9 cm Needle insertion depth: 4 cm Catheter type: closed end flexible Catheter size: 19 Gauge Catheter at skin depth: 9 cm Test dose: negative  Assessment Sensory level: T8 Events: blood not aspirated, injection not painful, no injection resistance, no paresthesia and negative IV test  Additional Notes Patient identified. Risks/Benefits/Options discussed with patient including but not limited to bleeding, infection, nerve damage, paralysis, failed block, incomplete pain control, headache, blood pressure changes, nausea, vomiting, reactions to medication both or allergic, itching and postpartum back pain. Confirmed with bedside nurse the patient's most recent platelet count. Confirmed with patient that they are not currently taking any anticoagulation, have any bleeding history or any family history of bleeding disorders. Patient expressed understanding and wished to proceed. All questions were answered. Sterile technique was used throughout the entire procedure. Please see nursing notes for vital signs. Test dose was given through epidural catheter and negative prior to continuing to dose epidural or start infusion. Warning signs of high block given to the patient including shortness of breath, tingling/numbness in hands, complete motor block,  or any concerning symptoms with instructions to call for help. Patient was given instructions on fall risk and not to get out of bed. All questions and concerns addressed with instructions to call with any issues or inadequate analgesia.  Reason for block:procedure for pain

## 2019-11-05 NOTE — Anesthesia Postprocedure Evaluation (Signed)
Anesthesia Post Note  Patient: Elizabeth [name]  Procedure(s) Performed: AN AD HOC LABOR EPIDURAL     Patient location during evaluation: Mother Baby Anesthesia Type: Epidural Level of consciousness: awake, awake and alert and oriented Pain management: pain level controlled Vital Signs Assessment: post-procedure vital signs reviewed and stable Respiratory status: spontaneous breathing and respiratory function stable Cardiovascular status: blood pressure returned to baseline Postop Assessment: no headache, epidural receding, patient able to bend at knees, adequate PO intake, no backache, no apparent nausea or vomiting and able to ambulate Anesthetic complications: no   No complications documented.  Last Vitals:  Vitals:   11/05/19 0525 11/05/19 0630  BP: 120/75 (!) 120/63  Pulse: 66 65  Resp: 16 16  Temp: 36.9 C 36.9 C  SpO2: 100% 100%    Last Pain:  Vitals:   11/05/19 0630  TempSrc: Oral  PainSc: 0-No pain   Pain Goal:                   Cleda Clarks

## 2019-11-05 NOTE — H&P (Signed)
Elizabeth [name] is a 34 y.o. female presenting in active labor, cervix 5 cm per MAU.  [id number], at 39.4 wks presenting in active labor with UCs, no loss of fluid or VB. Notes good FMs.  [name].  Dating by Elizabeth [name] c/w LMP.  AMA- nl NIPS Rh negative, S/p Rhogam in 1st trim for bleeding and again at 28 wks.  16 lbs wt gain. Growth for poor maternal weight gain- AGA at 28 wks GBS+, PCN or Amp in labor  Prior 3 SVDs, largest was her son, 3rd SVD 8'6"   OB History    Gravida  5   Para  3   Term  3   Preterm      AB  1   Living  3     SAB  1   TAB      Ectopic      Multiple  0   Live Births  3          Past Medical History:  Diagnosis Date  . Medical history non-contributory   . No pertinent past medical history    Past Surgical History:  Procedure Laterality Date  . NO PAST SURGERIES     Family History: family history is not on file. Social History:  reports that she has never smoked. She has never used smokeless tobacco. She reports that she does not drink alcohol and does not use drugs.     Maternal Diabetes: No- didn't do glucola but did self BS checks, all wnl. Genetic Screening: Normal NIPS Maternal Ultrasounds/Referrals: Isolated EIF (echogenic intracardiac focus) Fetal Ultrasounds or other Referrals:  None Maternal Substance Abuse:  No Significant Maternal Medications:  None Significant Maternal Lab Results:  Group B Strep positive and Rh negative Other Comments:  None  Review of Systems History Dilation: 5 Effacement (%): 70 Station: -2 Exam by:: Elizabeth [name], rnc  Height 5\' 9"  (1.753 m), weight 79.3 kg, unknown if currently breastfeeding. Exam Physical Exam  Physical exam:  A&O x 3, no acute distress. Pleasant HEENT neg, no thyromegaly Lungs CTA bilat CV RRR, S1S2 normal Abdo soft, non tender, non acute Extr no edema/ tenderness Pelvic per MAU RN Cx 5 cm/ Vx  FHT  130s + accels no decels mod variab- cat I Toco q 3-4  min  Prenatal labs: ABO, Rh:  A(-) Antibody:  Neg Rubella:  Imm RPR:   NR HBsAg:   Neg HIV:   Neg GBS:   Positive  Glucola- declined due to it caused migraines. Home BS checks have been wnl NIPS normal   Assessment/Plan: 34 yo G5P3013, 39.4 wks, active labor. Expectant mngmt EFW 7 lbs, anticipate SVD. GBS+, Ampicillin now, since h/o rapid progress. FHT cat I  31 11/05/2019, 12:46 AM

## 2019-11-06 NOTE — Progress Notes (Signed)
PPD # 1 S/P NSVD  Live born female  Birth Weight: 7 lb 14.3 oz (3580 g) APGAR: 9, 9  Newborn Delivery   Birth date/time: 11/05/2019 03:04:00 Delivery type: Vaginal, Spontaneous     Baby name: [facility] Delivering provider: MODY, VAISHALI  Episiotomy:None   Lacerations:1st degree;Perineal   Circumcision: Yes, desires inpatient  Feeding: breast  Pain control at delivery: Epidural   S:  Reports feeling good. Ready to go home but voices understanding regarding the 48 hour stay due to +GBS and inadequate treatment in labor.              Tolerating po/ No nausea or vomiting             Bleeding is moderate             Pain controlled with ibuprofen (OTC)             Up ad lib / ambulatory / voiding without difficulties   O:  A & O x 3, in no apparent distress              VS:  Vitals:   11/05/19 1030 11/05/19 1715 11/05/19 2145 11/06/19 0511  BP: 112/74 117/75 (!) 109/60 103/78  Pulse: 68 75 77 70  Resp: 16  18 16   Temp: 98.4 F (36.9 C) 98.4 F (36.9 C) 98.1 F (36.7 C) 97.9 F (36.6 C)  TempSrc: Oral  Oral Oral  SpO2: 100% 100%    Weight:      Height:        LABS:  Recent Labs    11/05/19 0048 11/05/19 0543  WBC 10.0 15.2*  HGB 11.5* 10.2*  HCT 37.8 33.4*  PLT 309 240    Blood type: --/--/A NEG (07/28 0048)  Rubella: Immune (01/05 0000)   I&O: I/O last 3 completed shifts: In: -  Out: 196 [Blood:196]          No intake/output data recorded.  Vaccines: TDaP Declined         Flu    Declined   Gen: AAO x 3, NAD  Abdomen: soft, non-tender, non-distended             Fundus: firm, non-tender, U-1  Perineum: repair intact, no edema  Lochia: moderate  Extremities: no edema, no calf pain or tenderness   A/P: PPD # 1 34 y.o., 04-30-1982   Principal Problem:   Postpartum care following vaginal delivery (7/28) Active Problems:   SVD (spontaneous vaginal delivery)   Indication for care in labor or delivery   First degree perineal laceration   Doing well - stable  status  Routine post partum orders  Anticipate discharge tomorrow after 48 hour window for +GBS without adequate treatment   10-09-2000, MSN, CNM 11/06/2019, 10:53 AM

## 2019-11-07 MED ORDER — IBUPROFEN 600 MG PO TABS: 600.0000 mg | ORAL_TABLET | Freq: Four times a day (QID) | ORAL | 0 refills | Status: DC

## 2019-11-07 NOTE — Discharge Summary (Signed)
Postpartum Discharge Summary  Date of Service updated     Patient Name: Elizabeth [name] DOB: May 29, 1985 MRN: [id number]  Date of admission: 11/05/2019 Delivery date:11/05/2019  Delivering provider: MODY, VAISHALI  Date of discharge: 11/07/2019  Admitting diagnosis: Indication for care in labor or delivery [O75.9] SVD (spontaneous vaginal delivery) [O80] Intrauterine pregnancy: [redacted]w[redacted]d     Secondary diagnosis:  Principal Problem:   Postpartum care following vaginal delivery (7/28) Active Problems:   SVD (spontaneous vaginal delivery)   Indication for care in labor or delivery   First degree perineal laceration  Additional problems: n/a    Discharge diagnosis: Term Pregnancy Delivered                                              Post partum procedures:none Augmentation: N/A Complications: None  Hospital course: Onset of Labor With Vaginal Delivery      34 y.o. yo L3Y1017 at [redacted]w[redacted]d was admitted in Active Labor on 11/05/2019. Patient had an uncomplicated labor course as follows:  Membrane Rupture Time/Date: 2:58 AM ,11/05/2019   Delivery Method:Vaginal, Spontaneous  Episiotomy: None  Lacerations:  1st degree;Perineal  Patient had an uncomplicated postpartum course.  She is ambulating, tolerating a regular diet, passing flatus, and urinating well. Patient is discharged home in stable condition on 11/07/19.  Newborn Data: Birth date:11/05/2019  Birth time:3:04 AM  Gender:Female  Living status:Living  Apgars:9 ,9  Weight:3580 g   MPhysical exam  Vitals:   11/06/19 0511 11/06/19 1430 11/06/19 2127 11/07/19 0518  BP: 103/78 (!) 97/62 107/66 (!) 90/58  Pulse: 70 82 64 62  Resp: 16 18 16 16   Temp: 97.9 F (36.6 C) 98 F (36.7 C) 98.2 F (36.8 C) 98 F (36.7 C)  TempSrc: Oral Oral  Oral  SpO2:  100% 100% 100%  Weight:      Height:       General: alert, cooperative and no distress Lochia: appropriate Uterine Fundus: firm Incision: N/A DVT Evaluation: No evidence of DVT  seen on physical exam. Labs: Lab Results  Component Value Date   WBC 15.2 (H) 11/05/2019   HGB 10.2 (L) 11/05/2019   HCT 33.4 (L) 11/05/2019   MCV 81.7 11/05/2019   PLT 240 11/05/2019   No flowsheet data found. Edinburgh Score: Edinburgh Postnatal Depression Scale Screening Tool 11/06/2019  I have been able to laugh and see the funny side of things. 0  I have looked forward with enjoyment to things. 0  I have blamed myself unnecessarily when things went wrong. 1  I have been anxious or worried for no good reason. 0  I have felt scared or panicky for no good reason. 1  Things have been getting on top of me. 1  I have been so unhappy that I have had difficulty sleeping. 0  I have felt sad or miserable. 0  I have been so unhappy that I have been crying. 0  The thought of harming myself has occurred to me. 0  Edinburgh Postnatal Depression Scale Total 3      After visit meds:  Allergies as of 11/07/2019      Reactions   Neomycin Swelling   Eyelid swell shut      Medication List    TAKE these medications   ibuprofen 600 MG tablet Commonly known as: ADVIL Take 1 tablet (600 mg total) by  mouth every 6 (six) hours.   prenatal multivitamin Tabs tablet Take 1 tablet by mouth at bedtime.        Discharge home in stable condition Infant Feeding: Breast Infant Disposition:home with mother; plan outpatient circumcision Discharge instruction: per After Visit Summary and Postpartum booklet. Activity: Advance as tolerated. Pelvic rest for 6 weeks.  Diet: routine diet Anticipated Birth Control: Unsure Postpartum Appointment:6 weeks Additional Postpartum F/U: none Future Appointments:No future appointments. Follow up Visit:      11/07/2019 Vick Frees, MD

## 2019-11-07 NOTE — Progress Notes (Signed)
No c/o; pain controlled, normal lochia Voids w/o difficulty Breastfeeding  Patient Vitals for the past 24 hrs:  BP Temp Temp src Pulse Resp SpO2  11/07/19 0518 (!) 90/58 98 F (36.7 C) Oral 62 16 100 %  11/06/19 2127 107/66 98.2 F (36.8 C) -- 64 16 100 %  11/06/19 1430 (!) 97/62 98 F (36.7 C) Oral 82 18 100 %   A&ox3 nml respirations Abd: soft,nt,nd; fundus firm and below umb 2cm LE: no edema, nt bilat  A/P ppd2 s/p svd 1. Doing well, d/c home today; f/u in 6 wks 2. Rh neg, plan rhogam prior to d/c I fneeded 3. RI 4. Acute anemia - iron q day

## 2021-02-10 ENCOUNTER — Other Ambulatory Visit: Payer: Self-pay

## 2021-02-10 ENCOUNTER — Encounter: Payer: Self-pay | Admitting: Podiatry

## 2021-02-10 ENCOUNTER — Ambulatory Visit (INDEPENDENT_AMBULATORY_CARE_PROVIDER_SITE_OTHER): Payer: Self-pay | Admitting: Podiatry

## 2021-02-10 DIAGNOSIS — Z79899 Other long term (current) drug therapy: Secondary | ICD-10-CM

## 2021-02-10 DIAGNOSIS — L603 Nail dystrophy: Secondary | ICD-10-CM

## 2021-02-10 MED ORDER — TERBINAFINE HCL 250 MG PO TABS: 250.0000 mg | ORAL_TABLET | Freq: Every day | ORAL | 0 refills | Status: DC

## 2021-02-10 NOTE — Progress Notes (Signed)
  Subjective:  Patient ID: Elizabeth [name], female    DOB: [birth year],  MRN: [id number] HPI Chief Complaint  Patient presents with   Nail Problem    Hallux bilateral (R>L) - thick, discolored nail x 7 years, did have tested at Dermatology and was Rx'd a nail lacquer   New Patient (Initial Visit)    35 y.o. female presents with the above complaint.   ROS: Denies fever chills nausea vomiting muscle aches pains calf pain back pain chest pain shortness of breath.  Past Medical History:  Diagnosis Date   Medical history non-contributory    No pertinent past medical history    Past Surgical History:  Procedure Laterality Date   NO PAST SURGERIES      Current Outpatient Medications:    terbinafine (LAMISIL) 250 MG tablet, Take 1 tablet (250 mg total) by mouth daily., Disp: 30 tablet, Rfl: 0  Allergies  Allergen Reactions   Neomycin Swelling    Eyelid swell shut   Review of Systems Objective:  There were no vitals filed for this visit.  General: Well developed, nourished, in no acute distress, alert and oriented x3   Dermatological: Skin is warm, dry and supple bilateral. Nails x 10 are well maintained; remaining integument appears unremarkable at this time. There are no open sores, no preulcerative lesions, no rash or signs of infection present.  Hallux nails demonstrate thick yellow friable tissue and nail plate.  Tender on palpation second digit right foot demonstrates some early tinea pedis.  No interdigital growth.  Vascular: Dorsalis Pedis artery and Posterior Tibial artery pedal pulses are 2/4 bilateral with immedate capillary fill time. Pedal hair growth present. No varicosities and no lower extremity edema present bilateral.   Neruologic: Grossly intact via light touch bilateral. Vibratory intact via tuning fork bilateral. Protective threshold with Semmes Wienstein monofilament intact to all pedal sites bilateral. Patellar and Achilles deep tendon reflexes 2+ bilateral. No  Babinski or clonus noted bilateral.   Musculoskeletal: No gross boney pedal deformities bilateral. No pain, crepitus, or limitation noted with foot and ankle range of motion bilateral. Muscular strength 5/5 in all groups tested bilateral.  Gait: Unassisted, Nonantalgic.    Radiographs:  None taken  Assessment & Plan:   Assessment: Most likely tinea pedis possible onychodystrophy associated with this also.  She previously had labs done somewhere else does not want to do anymore.  Plan: Started her on Lamisil today 250 mg tablets.  She will start 1 tablet daily.  Questions or concerns she will notify us immediately we did discuss this possible side effects and profile associated with this.  We also requested a complete metabolic panel and her requisition was provided to her today.  She would also like to start laser therapy as an adjunct and we will go ahead and have her consented for that and scheduled as well.     Zamirah Denny T. Perry, [place]

## 2021-02-11 LAB — COMPREHENSIVE METABOLIC PANEL
AG Ratio: 1.8 (calc) (ref 1.0–2.5)
ALT: 15 U/L (ref 6–29)
AST: 19 U/L (ref 10–30)
Albumin: 4.6 g/dL (ref 3.6–5.1)
Alkaline phosphatase (APISO): 33 U/L (ref 31–125)
BUN: 14 mg/dL (ref 7–25)
CO2: 24 mmol/L (ref 20–32)
Calcium: 9.6 mg/dL (ref 8.6–10.2)
Chloride: 105 mmol/L (ref 98–110)
Creat: 0.8 mg/dL (ref 0.50–0.97)
Globulin: 2.6 g/dL (calc) (ref 1.9–3.7)
Glucose, Bld: 98 mg/dL (ref 65–139)
Potassium: 4.2 mmol/L (ref 3.5–5.3)
Sodium: 139 mmol/L (ref 135–146)
Total Bilirubin: 0.7 mg/dL (ref 0.2–1.2)
Total Protein: 7.2 g/dL (ref 6.1–8.1)

## 2021-02-25 ENCOUNTER — Other Ambulatory Visit: Payer: Self-pay

## 2021-02-25 ENCOUNTER — Ambulatory Visit (INDEPENDENT_AMBULATORY_CARE_PROVIDER_SITE_OTHER): Payer: Self-pay | Admitting: *Deleted

## 2021-02-25 DIAGNOSIS — L603 Nail dystrophy: Secondary | ICD-10-CM

## 2021-02-25 DIAGNOSIS — B351 Tinea unguium: Secondary | ICD-10-CM

## 2021-02-25 MED ORDER — TERBINAFINE HCL 250 MG PO TABS: 250.0000 mg | ORAL_TABLET | Freq: Every day | ORAL | 0 refills | Status: DC

## 2021-02-25 NOTE — Patient Instructions (Signed)

## 2021-02-25 NOTE — Progress Notes (Signed)
Patient presents today for the 1st laser treatment. Diagnosed with mycotic nail infection by Dr. Al Corpus.   Toenail most affected hallux nails bilateral (R>L).  All other systems are negative.  Nails were filed thin. Laser therapy was administered to 1st toenails bilateral and patient tolerated the treatment well. All safety precautions were in place.   Patient is also taking lamisil. She had some pills leftover from the dermatologist, she never picked up the prescription from Dr. Al Corpus. I resent that in today for #30  Follow up in 4 weeks for laser # 2.  Picture of nails taken today to document visual progress

## 2021-03-15 ENCOUNTER — Telehealth: Payer: Self-pay | Admitting: Podiatry

## 2021-03-15 ENCOUNTER — Ambulatory Visit: Payer: Self-pay | Admitting: Podiatry

## 2021-03-15 DIAGNOSIS — Z79899 Other long term (current) drug therapy: Secondary | ICD-10-CM

## 2021-03-15 NOTE — Telephone Encounter (Signed)
Called patient, left message to pick up CMP orders to take to lab

## 2021-03-15 NOTE — Telephone Encounter (Signed)
Pt would like to know if she could bypass her next appt and have her blood work done. She states she's just seeing you so she could have it complete. She is self pay and didn't want to spend money on a visit she doesn't need. She is still having laser treatments. Please advise.

## 2021-03-15 NOTE — Addendum Note (Signed)
Addended byMaury Dus, Jeramine Delis L on: 03/15/2021 10:56 AM   Modules accepted: Orders

## 2021-03-17 ENCOUNTER — Ambulatory Visit: Payer: Self-pay | Admitting: Podiatry

## 2021-04-08 ENCOUNTER — Ambulatory Visit (INDEPENDENT_AMBULATORY_CARE_PROVIDER_SITE_OTHER): Payer: Self-pay | Admitting: *Deleted

## 2021-04-08 ENCOUNTER — Other Ambulatory Visit: Payer: Self-pay

## 2021-04-08 DIAGNOSIS — L603 Nail dystrophy: Secondary | ICD-10-CM

## 2021-04-08 DIAGNOSIS — B351 Tinea unguium: Secondary | ICD-10-CM

## 2021-04-08 LAB — COMPREHENSIVE METABOLIC PANEL
AG Ratio: 1.7 (calc) (ref 1.0–2.5)
ALT: 19 U/L (ref 6–29)
AST: 22 U/L (ref 10–30)
Albumin: 4.5 g/dL (ref 3.6–5.1)
Alkaline phosphatase (APISO): 39 U/L (ref 31–125)
BUN: 14 mg/dL (ref 7–25)
CO2: 28 mmol/L (ref 20–32)
Calcium: 9.5 mg/dL (ref 8.6–10.2)
Chloride: 102 mmol/L (ref 98–110)
Creat: 0.83 mg/dL (ref 0.50–0.97)
Globulin: 2.6 g/dL (calc) (ref 1.9–3.7)
Glucose, Bld: 98 mg/dL (ref 65–139)
Potassium: 4.2 mmol/L (ref 3.5–5.3)
Sodium: 138 mmol/L (ref 135–146)
Total Bilirubin: 0.5 mg/dL (ref 0.2–1.2)
Total Protein: 7.1 g/dL (ref 6.1–8.1)

## 2021-04-08 MED ORDER — TERBINAFINE HCL 250 MG PO TABS: 250.0000 mg | ORAL_TABLET | Freq: Every day | ORAL | 0 refills | Status: DC

## 2021-04-08 NOTE — Progress Notes (Signed)
Patient presents today for the 2nd laser treatment. Diagnosed with mycotic nail infection by Dr. Al Corpus.   Toenail most affected hallux nails bilateral (R>L).  All other systems are negative.  Nails were filed thin. Laser therapy was administered to 1st toenails bilateral and patient tolerated the treatment well. All safety precautions were in place.   Patient is also taking lamisil. She initially started with the prescription she got from the dermatologist. I refilled #30 at last visit and had her complete bloodwork.   Her bloodwork was normal and I did send in 90 days supply of lamisil today.   Follow up in 4 weeks for laser #3

## 2021-04-12 ENCOUNTER — Telehealth: Payer: Self-pay | Admitting: *Deleted

## 2021-04-12 NOTE — Telephone Encounter (Signed)
-----   Message from Elinor Parkinson, [place] sent at 04/12/2021  7:29 AM EST ----- Blood work looks good and may continue medication.

## 2021-05-13 ENCOUNTER — Ambulatory Visit (INDEPENDENT_AMBULATORY_CARE_PROVIDER_SITE_OTHER): Payer: Self-pay

## 2021-05-13 ENCOUNTER — Other Ambulatory Visit: Payer: Self-pay

## 2021-05-13 DIAGNOSIS — B351 Tinea unguium: Secondary | ICD-10-CM

## 2021-05-13 DIAGNOSIS — L603 Nail dystrophy: Secondary | ICD-10-CM

## 2021-05-13 NOTE — Progress Notes (Signed)
Patient presents today for the 2nd laser treatment. Diagnosed with mycotic nail infection by Dr. Al Corpus.   Toenail most affected hallux nails bilateral (R>L).  All other systems are negative.  Nails were filed thin. Laser therapy was administered to 1st toenails bilateral and patient tolerated the treatment well. All safety precautions were in place.   Patient is also taking lamisil. She initially started with the prescription she got from the dermatologist. I refilled #30 at last visit and had her complete bloodwork.   Her bloodwork was normal and I did send in 90 days supply of lamisil today.   Follow up in 6 weeks for laser #4

## 2021-06-24 ENCOUNTER — Ambulatory Visit (INDEPENDENT_AMBULATORY_CARE_PROVIDER_SITE_OTHER): Payer: Self-pay | Admitting: *Deleted

## 2021-06-24 ENCOUNTER — Other Ambulatory Visit: Payer: Self-pay

## 2021-06-24 DIAGNOSIS — L603 Nail dystrophy: Secondary | ICD-10-CM

## 2021-06-24 DIAGNOSIS — B351 Tinea unguium: Secondary | ICD-10-CM

## 2021-06-24 NOTE — Progress Notes (Signed)
Patient presents today for the 4th laser treatment. Diagnosed with mycotic nail infection by Dr. Al Corpus.  ? ?Toenail most affected hallux nails bilateral (R>L). The nails are looking some better. ? ?All other systems are negative. ? ?Nails were filed thin. Laser therapy was administered to 1st toenails bilateral and patient tolerated the treatment well. All safety precautions were in place.  ? ?Patient is continuing lamisil. She initially started with the prescription she got from the dermatologist.  ? ?Currently she has completed #60 days of lamisil, with another #60 tablets at home. She stopped about 5 weeks ago taking it because she was unsure of instructions on how to take. I recommended taking the lamisil for the next 30 days, one a day, and then starting the next #30 with one every other day. Patient demonstrated understanding of instructions ? ?Follow up in 6 weeks for laser #5. ? ?  ?

## 2021-08-05 ENCOUNTER — Ambulatory Visit (INDEPENDENT_AMBULATORY_CARE_PROVIDER_SITE_OTHER): Payer: Self-pay

## 2021-08-05 DIAGNOSIS — L603 Nail dystrophy: Secondary | ICD-10-CM

## 2021-08-05 DIAGNOSIS — B351 Tinea unguium: Secondary | ICD-10-CM

## 2021-08-05 NOTE — Progress Notes (Signed)
Patient presents today for the 5th laser treatment. Diagnosed with mycotic nail infection by Dr. Al Corpus.  ? ?Toenail most affected hallux nails bilateral (R>L). The nails are looking some better. ? ?All other systems are negative. ? ?Nails were filed thin. Laser therapy was administered to 1st toenails bilateral and patient tolerated the treatment well. All safety precautions were in place.  ? ?Patient is continuing lamisil. She initially started with the prescription she got from the dermatologist.  ? ?Currently she has completed #60 days of lamisil, with another #60 tablets at home. She stopped about 5 weeks ago taking it because she was unsure of instructions on how to take. I recommended taking the lamisil for the next 30 days, one a day, and then starting the next #30 with one every other day. Patient demonstrated understanding of instructions ? ?Follow up in 8 weeks for laser #6. ? ?  ?

## 2021-09-30 ENCOUNTER — Ambulatory Visit (INDEPENDENT_AMBULATORY_CARE_PROVIDER_SITE_OTHER): Payer: Self-pay

## 2021-09-30 ENCOUNTER — Telehealth: Payer: Self-pay

## 2021-09-30 DIAGNOSIS — B351 Tinea unguium: Secondary | ICD-10-CM

## 2021-09-30 DIAGNOSIS — L603 Nail dystrophy: Secondary | ICD-10-CM

## 2022-01-03 ENCOUNTER — Ambulatory Visit (INDEPENDENT_AMBULATORY_CARE_PROVIDER_SITE_OTHER): Payer: Self-pay | Admitting: Podiatry

## 2022-01-03 ENCOUNTER — Encounter: Payer: Self-pay | Admitting: Podiatry

## 2022-01-03 DIAGNOSIS — L603 Nail dystrophy: Secondary | ICD-10-CM

## 2022-01-03 NOTE — Progress Notes (Signed)
She presents today for laser for an nail fungus follow-up she is a completed 6 laser treatments is definitely lighter but this 1 has spread back toward the cuticle as she refers to the hallux left.  She states that is really not that much improved they are little flatter may be but still discolored and spent a year.  Objective: Vital signs stable alert oriented x3.  Pulses are palpable.  Looking at the hallux nails bilaterally I think another nail biopsy would be in order.  The previous one come from dermatology so I feel that we probably ought to do her own.  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  Plan: Cannot rule out onychomycosis to samples of the skin and nail today we will follow-up with her in 1 month

## 2022-01-10 NOTE — Telephone Encounter (Signed)
note

## 2022-02-09 ENCOUNTER — Telehealth: Payer: Self-pay | Admitting: Podiatry

## 2022-02-09 ENCOUNTER — Ambulatory Visit: Payer: Self-pay | Admitting: Podiatry

## 2022-02-09 NOTE — Telephone Encounter (Signed)
Pt called and had an appt today but does not have a babysitter for her 36 yr old and was wanting to know if you could do a virtual visit to go over the bako results.

## 2022-02-09 NOTE — Telephone Encounter (Signed)
Patient is aware of her results and states that she has done all oral and laser tx in the past. Patient would like to know what is another treatment option that she could do instead.

## 2022-02-09 NOTE — Telephone Encounter (Signed)
It appears patient only completed laser treatments, but did not complete the lamisil therapy. There are other oral medications, but she will need an appointment to discuss all of this.

## 2022-02-20 ENCOUNTER — Telehealth: Payer: Self-pay | Admitting: *Deleted

## 2022-02-20 NOTE — Telephone Encounter (Signed)
Called patient to reschedule upcoming appointment to discuss treatment options, no answer, could not leave voice message. She had an appointment on 11/02, cancelled.

## 2022-02-23 MED ORDER — TERBINAFINE HCL 250 MG PO TABS: 250.0000 mg | ORAL_TABLET | Freq: Every day | ORAL | 0 refills | Status: DC

## 2022-02-23 NOTE — Addendum Note (Signed)
Addended by: Lottie Rater E on: 02/23/2022 04:51 PM   Modules accepted: Orders

## 2022-02-24 NOTE — Telephone Encounter (Signed)
Called patient , left voice message that medication has been sent and to f/u w/ office in 4 months.

## 2023-04-11 NOTE — L&D Delivery Note (Signed)
 Delivery Note At 9:30 AM a viable and healthy female was delivered via Vaginal, Spontaneous (Presentation:   Occiput Anterior).  APGAR: 8, 9; weight 8 lb 15 oz (4054 g).   Placenta status: Spontaneous, Intact.  Cord: 3 vessels with the following complications: None.  Cord pH: NA  Anesthesia: Local Episiotomy: None Lacerations: 1st degree;Perineal Suture Repair: 3.0 vicryl rapide Est. Blood Loss (mL): 326  Plus she bled at home and 150cc in L&D before delivery from low placenta   Mom to postpartum.  Baby to Couplet care / Skin to Skin.  Senya Hinzman R Macarena Langseth 10/27/2023, 11:35 AM

## 2023-09-12 ENCOUNTER — Other Ambulatory Visit: Payer: Self-pay | Admitting: Obstetrics & Gynecology

## 2023-09-12 DIAGNOSIS — O4403 Placenta previa specified as without hemorrhage, third trimester: Secondary | ICD-10-CM

## 2023-09-18 DIAGNOSIS — O4442 Low lying placenta NOS or without hemorrhage, second trimester: Secondary | ICD-10-CM | POA: Insufficient documentation

## 2023-09-18 DIAGNOSIS — O43193 Other malformation of placenta, third trimester: Secondary | ICD-10-CM | POA: Insufficient documentation

## 2023-09-18 DIAGNOSIS — O4400 Placenta previa specified as without hemorrhage, unspecified trimester: Secondary | ICD-10-CM | POA: Insufficient documentation

## 2023-09-21 ENCOUNTER — Ambulatory Visit: Payer: Self-pay | Attending: Obstetrics and Gynecology

## 2023-09-21 ENCOUNTER — Other Ambulatory Visit: Payer: Self-pay | Admitting: *Deleted

## 2023-09-21 ENCOUNTER — Other Ambulatory Visit: Payer: Self-pay | Admitting: Obstetrics & Gynecology

## 2023-09-21 ENCOUNTER — Ambulatory Visit: Payer: Self-pay | Admitting: Maternal & Fetal Medicine

## 2023-09-21 VITALS — BP 122/70 | HR 79

## 2023-09-21 DIAGNOSIS — O09523 Supervision of elderly multigravida, third trimester: Secondary | ICD-10-CM

## 2023-09-21 DIAGNOSIS — O4442 Low lying placenta NOS or without hemorrhage, second trimester: Secondary | ICD-10-CM

## 2023-09-21 DIAGNOSIS — Z3A35 35 weeks gestation of pregnancy: Secondary | ICD-10-CM

## 2023-09-21 DIAGNOSIS — O43193 Other malformation of placenta, third trimester: Secondary | ICD-10-CM

## 2023-09-21 DIAGNOSIS — O4403 Placenta previa specified as without hemorrhage, third trimester: Secondary | ICD-10-CM

## 2023-09-21 DIAGNOSIS — O4443 Low lying placenta NOS or without hemorrhage, third trimester: Secondary | ICD-10-CM

## 2023-09-21 DIAGNOSIS — Z363 Encounter for antenatal screening for malformations: Secondary | ICD-10-CM

## 2023-09-21 NOTE — Progress Notes (Signed)
 Patient information  Patient Name: Elizabeth [name]  Patient MRN:   [id number]  Referring practice: [facility] Referring Provider: Retta Caster - Briant Camper R. Ricky Charter, MD  Problem List   Patient Active Problem List   Diagnosis Date Noted   Marginal insertion of umbilical cord affecting management of mother in third trimester 09/18/2023   Placenta previa 09/18/2023   Multiple thyroid nodules 12/24/2017   Rh negative status during pregnancy 10/11/2014   Maternal Fetal medicine Consult  Elizabeth [name] is a 38 y.o. [id number] at [redacted]w[redacted]d here for ultrasound and consultation. Elizabeth [name] is doing well today with no acute concerns. Today we focused on the following:   Low lying placenta I discussed the ultrasound findings with the patient showing a low-lying placenta.  The placenta is approximately 1.2 to 1.8 cm away from the internal os.  There are some large maternal vessels within the uterine wall that may have been mistaken for fetal vessels but based on my assessment today these do appear to be continuous with the uterine wall.  I discussed the increased risk of bleeding during a vaginal delivery and labor with a low-lying placenta.  I discussed that in 2 weeks we can reassess the placenta with a transvaginal ultrasound to see if it is moved greater than 2 cm out of the way.  Regardless of that ultrasound findings the patient would like to try to vaginal birth.  The patient had time to ask questions that were answered to her satisfaction.  She verbalized understanding and agrees to proceed with the plan below.  Sonographic findings Single intrauterine pregnancy at 35w 1d.  Fetal cardiac activity:  Observed and appears normal. Presentation: Cephalic. Interval fetal anatomy appears normal. Fetal biometry shows the estimated fetal weight at the 76 percentile. Amniotic fluid volume: Within normal limits. MVP: 4.61 cm. Placenta: Anterior, low-lying, 1.2-1.8 cm from the internal.   There are limitations of  prenatal ultrasound such as the inability to detect certain abnormalities due to poor visualization. Various factors such as fetal position, gestational age and maternal body habitus may increase the difficulty in visualizing the fetal anatomy.    Recommendations - Follow-up in 2 weeks to confirm the low-lying placenta and see if it is no longer low-lying - Vaginal birth is preferred with the caveat of an increased risk of vaginal bleeding during labor  Review of Systems: A review of systems was performed and was negative except per HPI   Vitals and Physical Exam    09/21/2023   10:03 AM 11/07/2019    5:18 AM 11/06/2019    9:27 PM  Vitals with BMI  Systolic 122 90 107  Diastolic 70 58 66  Pulse 79 62 64    Sitting comfortably on the sonogram table Nonlabored breathing Normal rate and rhythm Abdomen is nontender  Past pregnancies OB History  Gravida Para Term Preterm AB Living  6 4 4  1 4   SAB IAB Ectopic Multiple Live Births  1   0 4    # Outcome Date GA Lbr Len/2nd Weight Sex Type Anes PTL Lv  6 Current           5 Term 11/05/19 [redacted]w[redacted]d 00:25 / 00:06 7 lb 14.3 oz (3.58 kg) M Vag-Spont EPI  LIV     Birth Comments: WDL   4 Term 10/10/14 [redacted]w[redacted]d 15:32 / 00:18 8 lb 4.8 oz (3.765 kg) M Vag-Spont EPI  LIV  3 Term 04/21/13  01:30 / 00:04 7 lb 10.8 oz (3.481 kg) F  Vag-Spont None  LIV  2 Term 03/06/11 [redacted]w[redacted]d 32:28 / 02:22 8 lb 1 oz (3.657 kg) F Vag-Spont EPI  LIV     Birth Comments: na  1 SAB              I spent 20 minutes reviewing the patients chart, including labs and images as well as counseling the patient about her medical conditions. Greater than 50% of the time was spent in direct face-to-face patient counseling.  Penney Bowling  MFM, [facility]   09/21/2023  12:03 PM

## 2023-09-28 LAB — OB RESULTS CONSOLE GBS: GBS: NEGATIVE

## 2023-10-05 ENCOUNTER — Telehealth: Payer: Self-pay

## 2023-10-26 ENCOUNTER — Other Ambulatory Visit: Payer: Self-pay | Admitting: Obstetrics & Gynecology

## 2023-10-27 ENCOUNTER — Other Ambulatory Visit: Payer: Self-pay

## 2023-10-27 ENCOUNTER — Inpatient Hospital Stay (HOSPITAL_COMMUNITY)
Admission: AD | Admit: 2023-10-27 | Discharge: 2023-10-28 | DRG: 807 | Disposition: A | Discharge status: Home / Self Care | Payer: Self-pay | Attending: Obstetrics & Gynecology | Admitting: Obstetrics & Gynecology

## 2023-10-27 ENCOUNTER — Encounter (HOSPITAL_COMMUNITY): Payer: Self-pay | Admitting: Obstetrics & Gynecology

## 2023-10-27 DIAGNOSIS — O26899 Other specified pregnancy related conditions, unspecified trimester: Secondary | ICD-10-CM

## 2023-10-27 DIAGNOSIS — O26893 Other specified pregnancy related conditions, third trimester: Secondary | ICD-10-CM | POA: Diagnosis present

## 2023-10-27 DIAGNOSIS — Z3A40 40 weeks gestation of pregnancy: Secondary | ICD-10-CM

## 2023-10-27 DIAGNOSIS — Z6791 Unspecified blood type, Rh negative: Secondary | ICD-10-CM

## 2023-10-27 DIAGNOSIS — O43123 Velamentous insertion of umbilical cord, third trimester: Secondary | ICD-10-CM | POA: Diagnosis present

## 2023-10-27 DIAGNOSIS — Z3490 Encounter for supervision of normal pregnancy, unspecified, unspecified trimester: Secondary | ICD-10-CM | POA: Diagnosis present

## 2023-10-27 DIAGNOSIS — O4453 Low lying placenta with hemorrhage, third trimester: Principal | ICD-10-CM | POA: Diagnosis present

## 2023-10-27 DIAGNOSIS — O4442 Low lying placenta NOS or without hemorrhage, second trimester: Secondary | ICD-10-CM | POA: Diagnosis present

## 2023-10-27 LAB — TYPE AND SCREEN
ABO/RH(D): A NEG
Antibody Screen: NEGATIVE

## 2023-10-27 LAB — CBC
HCT: 38.8 % (ref 36.0–46.0)
Hemoglobin: 13.1 g/dL (ref 12.0–15.0)
MCH: 28.9 pg (ref 26.0–34.0)
MCHC: 33.8 g/dL (ref 30.0–36.0)
MCV: 85.5 fL (ref 80.0–100.0)
Platelets: 256 K/uL (ref 150–400)
RBC: 4.54 MIL/uL (ref 3.87–5.11)
RDW: 13.6 % (ref 11.5–15.5)
WBC: 8.8 K/uL (ref 4.0–10.5)
nRBC: 0 % (ref 0.0–0.2)

## 2023-10-27 LAB — RPR: RPR Ser Ql: NONREACTIVE

## 2023-10-27 MED ORDER — OXYTOCIN-SODIUM CHLORIDE 30-0.9 UT/500ML-% IV SOLN
1.0000 m[IU]/min | INTRAVENOUS | Status: DC
  Administered 2023-10-27: 1 m[IU]/min via INTRAVENOUS
  Filled 2023-10-27: qty 500

## 2023-10-27 MED ORDER — OXYTOCIN-SODIUM CHLORIDE 30-0.9 UT/500ML-% IV SOLN
2.5000 [IU]/h | INTRAVENOUS | Status: DC
  Administered 2023-10-27: 2.5 [IU]/h via INTRAVENOUS

## 2023-10-27 MED ORDER — TETANUS-DIPHTH-ACELL PERTUSSIS 5-2.5-18.5 LF-MCG/0.5 IM SUSY: 0.5000 mL | PREFILLED_SYRINGE | Freq: Once | INTRAMUSCULAR | Status: DC

## 2023-10-27 MED ORDER — DIPHENHYDRAMINE HCL 50 MG/ML IJ SOLN: 12.5000 mg | INTRAMUSCULAR | Status: DC | PRN

## 2023-10-27 MED ORDER — TRANEXAMIC ACID-NACL 1000-0.7 MG/100ML-% IV SOLN
INTRAVENOUS | Status: AC
  Administered 2023-10-27: 1000 mg
  Filled 2023-10-27: qty 100

## 2023-10-27 MED ORDER — ACETAMINOPHEN 325 MG PO TABS: 650.0000 mg | ORAL_TABLET | ORAL | Status: DC | PRN

## 2023-10-27 MED ORDER — KETOROLAC TROMETHAMINE 30 MG/ML IJ SOLN
30.0000 mg | Freq: Once | INTRAMUSCULAR | Status: AC
  Administered 2023-10-27: 30 mg via INTRAVENOUS
  Filled 2023-10-27: qty 1

## 2023-10-27 MED ORDER — METHYLERGONOVINE MALEATE 0.2 MG/ML IJ SOLN: 0.2000 mg | Freq: Once | INTRAMUSCULAR | Status: DC

## 2023-10-27 MED ORDER — LACTATED RINGERS IV SOLN: 500.0000 mL | Freq: Once | INTRAVENOUS | Status: DC

## 2023-10-27 MED ORDER — OXYTOCIN BOLUS FROM INFUSION
333.0000 mL | Freq: Once | INTRAVENOUS | Status: AC
  Administered 2023-10-27: 333 mL via INTRAVENOUS

## 2023-10-27 MED ORDER — WITCH HAZEL-GLYCERIN EX PADS: 1.0000 | MEDICATED_PAD | CUTANEOUS | Status: DC | PRN

## 2023-10-27 MED ORDER — DIBUCAINE (PERIANAL) 1 % EX OINT
1.0000 | TOPICAL_OINTMENT | CUTANEOUS | Status: DC | PRN
Start: 2023-10-27 — End: 2023-10-28

## 2023-10-27 MED ORDER — PRENATAL MULTIVITAMIN CH
1.0000 | ORAL_TABLET | Freq: Every day | ORAL | Status: DC
  Filled 2023-10-27: qty 1

## 2023-10-27 MED ORDER — IBUPROFEN 600 MG PO TABS
600.0000 mg | ORAL_TABLET | Freq: Four times a day (QID) | ORAL | Status: DC
  Administered 2023-10-27 – 2023-10-28 (×3): 600 mg via ORAL
  Filled 2023-10-27 (×4): qty 1

## 2023-10-27 MED ORDER — EPHEDRINE 5 MG/ML INJ: 10.0000 mg | INTRAVENOUS | Status: DC | PRN

## 2023-10-27 MED ORDER — SENNOSIDES-DOCUSATE SODIUM 8.6-50 MG PO TABS
2.0000 | ORAL_TABLET | Freq: Every day | ORAL | Status: DC
  Administered 2023-10-28: 2 via ORAL
  Filled 2023-10-27: qty 2

## 2023-10-27 MED ORDER — COCONUT OIL OIL: 1.0000 | TOPICAL_OIL | Status: DC | PRN

## 2023-10-27 MED ORDER — FENTANYL-BUPIVACAINE-NACL 0.5-0.125-0.9 MG/250ML-% EP SOLN
12.0000 mL/h | EPIDURAL | Status: DC | PRN
  Filled 2023-10-27: qty 250

## 2023-10-27 MED ORDER — ACETAMINOPHEN 325 MG PO TABS
650.0000 mg | ORAL_TABLET | ORAL | Status: DC | PRN
  Administered 2023-10-27: 650 mg via ORAL
  Filled 2023-10-27: qty 2

## 2023-10-27 MED ORDER — SOD CITRATE-CITRIC ACID 500-334 MG/5ML PO SOLN: 30.0000 mL | ORAL | Status: DC | PRN

## 2023-10-27 MED ORDER — LACTATED RINGERS IV SOLN: INTRAVENOUS | Status: DC

## 2023-10-27 MED ORDER — SIMETHICONE 80 MG PO CHEW: 80.0000 mg | CHEWABLE_TABLET | ORAL | Status: DC | PRN

## 2023-10-27 MED ORDER — PHENYLEPHRINE 80 MCG/ML (10ML) SYRINGE FOR IV PUSH (FOR BLOOD PRESSURE SUPPORT): 80.0000 ug | PREFILLED_SYRINGE | INTRAVENOUS | Status: DC | PRN

## 2023-10-27 MED ORDER — LACTATED RINGERS IV SOLN: 500.0000 mL | INTRAVENOUS | Status: DC | PRN

## 2023-10-27 MED ORDER — LIDOCAINE HCL (PF) 1 % IJ SOLN
30.0000 mL | INTRAMUSCULAR | Status: AC | PRN
  Administered 2023-10-27: 30 mL via SUBCUTANEOUS
  Filled 2023-10-27: qty 30

## 2023-10-27 MED ORDER — ZOLPIDEM TARTRATE 5 MG PO TABS: 5.0000 mg | ORAL_TABLET | Freq: Every evening | ORAL | Status: DC | PRN

## 2023-10-27 MED ORDER — DIPHENHYDRAMINE HCL 25 MG PO CAPS: 25.0000 mg | ORAL_CAPSULE | Freq: Four times a day (QID) | ORAL | Status: DC | PRN

## 2023-10-27 MED ORDER — OXYTOCIN-SODIUM CHLORIDE 30-0.9 UT/500ML-% IV SOLN: 1.0000 m[IU]/min | INTRAVENOUS | Status: DC

## 2023-10-27 MED ORDER — TERBUTALINE SULFATE 1 MG/ML IJ SOLN: 0.2500 mg | Freq: Once | INTRAMUSCULAR | Status: DC | PRN

## 2023-10-27 MED ORDER — METHYLERGONOVINE MALEATE 0.2 MG/ML IJ SOLN
INTRAMUSCULAR | Status: AC
  Administered 2023-10-27: 0.2 mg via INTRAMUSCULAR
  Filled 2023-10-27: qty 1

## 2023-10-27 MED ORDER — ONDANSETRON HCL 4 MG PO TABS: 4.0000 mg | ORAL_TABLET | ORAL | Status: DC | PRN

## 2023-10-27 MED ORDER — ONDANSETRON HCL 4 MG/2ML IJ SOLN: 4.0000 mg | INTRAMUSCULAR | Status: DC | PRN

## 2023-10-27 MED ORDER — BENZOCAINE-MENTHOL 20-0.5 % EX AERO: 1.0000 | INHALATION_SPRAY | CUTANEOUS | Status: DC | PRN

## 2023-10-27 MED ORDER — ONDANSETRON HCL 4 MG/2ML IJ SOLN: 4.0000 mg | Freq: Four times a day (QID) | INTRAMUSCULAR | Status: DC | PRN

## 2023-10-27 NOTE — Progress Notes (Signed)
 Elizabeth [name] is a 38 y.o. [id number] at [redacted]w[redacted]d, here for vaginal bleeding from low lying placenta and is in early labor. Desires vaginal birth [place] at home. Another gush here at 7.30 am per RN.   Subjective: Feeling some UCs, tolerating well. Pitocin  1x1, at 5 mu   Objective: BP 127/71   Pulse 100   Temp (!) 97.2 F (36.2 C) (Axillary)   Resp 16   Ht 5' 9 (1.753 m)   Wt 86.1 kg   LMP 01/18/2023   SpO2 100%   BMI 28.03 kg/m  No intake/output data recorded. No intake/output data recorded.  FHT:  cat I  UC:   regular, every 3-4  minutes  pitocin  at 5 mu SVE:   4/60% /-2 AROM clear   Labs: Lab Results  Component Value Date   WBC 8.8 10/27/2023   HGB 13.1 10/27/2023   HCT 38.8 10/27/2023   MCV 85.5 10/27/2023   PLT 256 10/27/2023    Assessment / Plan: Augmentation of labor, progressing well Low placenta, intermittent bleeding  Labor: Progressing normally  Fetal Wellbeing:  Category I Pain Control:  Labor support without medications I/D:  n/a Anticipated MOD:  NSVD  Robbi JONELLE Render, MD 10/27/2023, 9:09 AM

## 2023-10-27 NOTE — MAU Provider Note (Signed)
 History     CSN: [id number]  Arrival date and time: 10/27/23 0148   Event Date/Time   First Provider Initiated Contact with Patient 10/27/23 0252      Chief Complaint  Patient presents with   Vaginal Bleeding   Elizabeth [name] is a 38 y.o. [id number] at [redacted]w[redacted]d who receives care at [facility].  Patient reports she has IOL scheduled for July 25th.  She presents today for vaginal bleeding. She reports waking up at 0100 noting blood.  She states cramping started shortly after. No passing of clots. Patient endorses sexual activity in the past 3 days.   Review of [facility] prenatal records: Completed Significant findings: Resolved Placenta Previa, Low Lying Placenta < 2cm, MCI, AMA  OB History     Gravida  6   Para  4   Term  4   Preterm      AB  1   Living  4      SAB  1   IAB      Ectopic      Multiple  0   Live Births  4           Past Medical History:  Diagnosis Date   First degree perineal laceration 11/05/2019   Medical history non-contributory    No pertinent past medical history    SVD (spontaneous vaginal delivery) 10/10/2014    Past Surgical History:  Procedure Laterality Date   NO PAST SURGERIES      Family History  Problem Relation Age of Onset   Diabetes Neg Hx    Hypertension Neg Hx     Social History   Tobacco Use   Smoking status: Never   Smokeless tobacco: Never  Vaping Use   Vaping status: Never Used  Substance Use Topics   Alcohol use: No   Drug use: No    Allergies:  Allergies  Allergen Reactions   Neomycin Swelling    Eyelid swell shut    Medications Prior to Admission  Medication Sig Dispense Refill Last Dose/Taking   Prenatal Vit-Fe Fumarate-FA (PRENATAL MULTIVITAMIN) TABS tablet Take 1 tablet by mouth daily at 12 noon.   10/26/2023   terbinafine  (LAMISIL ) 250 MG tablet Take 1 tablet (250 mg total) by mouth daily. (Patient not taking: Reported on 09/21/2023) 90 tablet 0    trimethoprim-polymyxin b (POLYTRIM)  ophthalmic solution 1 drop every 3 (three) hours. (Patient not taking: Reported on 09/21/2023)       Review of Systems  Eyes:  Negative for visual disturbance.  Gastrointestinal:  Negative for constipation, diarrhea, nausea and vomiting.  Genitourinary:  Positive for vaginal bleeding and vaginal discharge (No itching, odor, or irritation.). Negative for difficulty urinating, dyspareunia and dysuria.  Neurological:  Negative for dizziness, light-headedness and headaches.   Physical Exam   Blood pressure 117/78, pulse 79, temperature 98.4 F (36.9 C), temperature source Oral, resp. rate 16, height 5' 9 (1.753 m), weight 86.1 kg, last menstrual period 01/18/2023, SpO2 100%, unknown if currently breastfeeding.  Physical Exam Vitals reviewed. Exam conducted with a chaperone present Reggy, RN).  Constitutional:      Appearance: Normal appearance.  Eyes:     Conjunctiva/sclera: Conjunctivae normal.  Cardiovascular:     Rate and Rhythm: Normal rate.  Pulmonary:     Effort: Pulmonary effort is normal. No respiratory distress.  Abdominal:     Palpations: Abdomen is soft.  Genitourinary:    Comments:  Sterile Speculum Exam: -Normal External Genitalia: Right labia with large varicosity  noted. Non tender Small amt blood noted at introitus.  -Vaginal Vault: Pink mucosa with good rugae. Small to moderate amt blood in vault that was partially removed with faux swab x 2. Appears bright red.  Nickel sized clot removed that was dark red. -Cervix: Not visualized d/t position and blood. Active bleeding not ruled out.  -Bimanual Exam: Dilation: 3.5 Effacement (%): 50 Station: -3 Presentation: Vertex Exam by:: Harlene Duncans, CNM Musculoskeletal:        General: Normal range of motion.     Cervical back: Normal range of motion.  Skin:    General: Skin is warm and dry.  Neurological:     Mental Status: She is alert and oriented to person, place, and time.  Psychiatric:        Mood and Affect:  Mood normal.        Behavior: Behavior normal.     Fetal Assessment 125 bpm, Mod Var, -Decels, +Accels Toco: Q16min  MAU Course  No results found for this or any previous visit (from the past 24 hours). No results found.   MDM PE Labs: None EFM  Assessment and Plan   38 year old [id number]  SIUP at 40.2 weeks Cat I FT Vaginal Bleeding Known [facility]  -POC Reviewed -Exam performed and findings discussed. -Informed that in setting of 40+wk GA and vaginal bleeding recommendation for admission. -Patient and husband verbalizes understanding. Questions regarding MD presence and induction addressed.  -Dr. ALONSO Render called and informed of patient status, evaluation, interventions, and results. Advised: *Agrees with admission. *Will place orders *Reassure patient that she will not currently require pitocin  or other interventions considering actively contracting.  *Will come to assess patient.  -Patient and husband updated on POC.   Harlene LITTIE Duncans MSN, CNM 10/27/2023, 2:52 AM

## 2023-10-27 NOTE — MAU Note (Signed)
..  Elizabeth [name] is a 38 y.o. at [redacted]w[redacted]d here in MAU reporting: vaginal bleeding since 0100. Reports that she bleed through her underwear and onto pijama pants. Denies pain. +FM  Pain score: 0/10 Vitals:   10/27/23 0211  BP: 122/77  Pulse: 76  Resp: 16  Temp: 98.4 F (36.9 C)  SpO2: 100%     FHT:120 Lab orders placed from triage:  none

## 2023-10-27 NOTE — H&P (Signed)
 Elizabeth [name] is a 38 y.o. female [phone], 4 SVDs, presenting for bleeding at 40.2 weeks, Known Low Placenta, pt called and was asked to come to MAU for admission. Bleeding was sudden, got up to use bathroom and went through her pajamas. Since then the pad she wore to MAU was minimally saturated, In MAU, the provider noted a small clot and some blood. Pt is stable. Patient strongly desires vaginal delivery and avoid c-section.   AMA, NIPT low risk, Rh neg, Marginal cord, anterior Previa that resolved to low placenta at 35.2 wks seen by MFM Growth AGA, last sono w/ MFM at 35 wks 6'5 76% AC 95%  low placenta 1.2-1.8 cm from internal os   OB History     Gravida  6   Para  4   Term  4   Preterm      AB  1   Living  4      SAB  1   IAB      Ectopic      Multiple  0   Live Births  4          Past Medical History:  Diagnosis Date   First degree perineal laceration 11/05/2019   Medical history non-contributory    No pertinent past medical history    SVD (spontaneous vaginal delivery) 10/10/2014   Past Surgical History:  Procedure Laterality Date   NO PAST SURGERIES     Family History: family history is not on file. Social History:  reports that she has never smoked. She has never used smokeless tobacco. She reports that she does not drink alcohol and does not use drugs.     Maternal Diabetes: No Genetic Screening: Normal NIPT low risk  Maternal Ultrasounds/Referrals: Normal except placenta previa that resolved at 35 wks  Fetal Ultrasounds or other Referrals:  Referred to Materal Fetal Medicine  for placenta, no fetal concerns  Maternal Substance Abuse:  No Significant Maternal Medications:  None Significant Maternal Lab Results:  Group B Strep negative  Rh neg (fetal status Rh neg per NIPT, mother declined Rhogam)  Number of Prenatal Visits:greater than 3 verified prenatal visits Maternal Vaccinations:none Other Comments:  None  Review of Systems  vag  bleeding History Dilation: 3.5 Effacement (%): 50 Station: -3 Exam by:: Harlene Duncans, CNM Blood pressure (!) 123/57, pulse 98, temperature 98.4 F (36.9 C), temperature source Oral, resp. rate 16, height 5' 9 (1.753 m), weight 86.1 kg, last menstrual period 01/18/2023, SpO2 100%, unknown if currently breastfeeding. Exam Physical Exam    A&O x 3, no acute distress. Pleasant HEENT neg, no thyromegaly Lungs CTA bilat CV RRR, S1S2 normal Abdo soft, non tender, non acute Extr no edema/ tenderness Pelvic deferred since check in MAU FHT  120s mod variability + accels no decels cat I  Toco q 5-6 min  Prenatal labs: ABO, Rh: A Neg Antibody:neg  Rubella:  Immune VZ immune RPR:   Neg HBsAg:   neg Hep C neg HIV:   Neg  GBS: Negative/-- (06/20 0000)  Glucola nl  NIPT low risk AFP1 neg   Assessment/Plan: 38 yo [id number] at 40.2 wks here for vaginal bleeding from low placenta, early labor. EFW 8.1/2 lbs FHT cat I  Pt is stable. After counseling again in MAU, plan is to attempt vaginal delivery as long as bleeding is reasonable and will closely watch vitals and pad.  Recommend labor augmentation to allow shorter time to delivery to limit maternal bleeding, I d/w pt  very low dose pitocin , 1x1 and she agrees [facility] to ambulate If bleeding is minimal Prepare room for [facility] with meds and possible Jada if needed    Robbi JONELLE Render 10/27/2023, 4:07 AM

## 2023-10-28 LAB — CBC
HCT: 36.5 % (ref 36.0–46.0)
Hemoglobin: 12.2 g/dL (ref 12.0–15.0)
MCH: 29.1 pg (ref 26.0–34.0)
MCHC: 33.4 g/dL (ref 30.0–36.0)
MCV: 87.1 fL (ref 80.0–100.0)
Platelets: 225 K/uL (ref 150–400)
RBC: 4.19 MIL/uL (ref 3.87–5.11)
RDW: 13.9 % (ref 11.5–15.5)
WBC: 10.8 K/uL — ABNORMAL HIGH (ref 4.0–10.5)
nRBC: 0 % (ref 0.0–0.2)

## 2023-10-28 LAB — BIRTH TISSUE RECOVERY COLLECTION (PLACENTA DONATION)

## 2023-10-28 MED ORDER — IBUPROFEN 600 MG PO TABS: 600.0000 mg | ORAL_TABLET | Freq: Four times a day (QID) | ORAL | 0 refills | Status: AC

## 2023-10-28 MED ORDER — ACETAMINOPHEN 325 MG PO TABS: 650.0000 mg | ORAL_TABLET | ORAL | Status: AC | PRN

## 2023-10-28 NOTE — Progress Notes (Signed)
 Post Partum Day 1 SVD, Boy. Breast feeding. Not planning circumcision. Not done vit K or any newborn meds.  Low lying placenta with predelivery bleeding but no PPH. Pt received prophylactic TXA at delivery and Methergine  post-placenta delivery   Subjective: no complaints, up ad lib, voiding, tolerating PO, + flatus, and bleeding is minimal   Objective: BP 107/72   Pulse 72   Temp 98 F (36.7 C)   Resp 16   Ht 5' 9 (1.753 m)   Wt 86.1 kg   LMP 01/18/2023   SpO2 100%   Breastfeeding Unknown   BMI 28.03 kg/m   Physical Exam:  General: alert and cooperative Lochia: appropriate Uterine Fundus: firm Incision: no concerns  DVT Evaluation: No evidence of DVT seen on physical exam. Negative Homan's sign.     Latest Ref Rng & Units 10/28/2023    5:19 AM 10/27/2023    3:38 AM   CBC  WBC 4.0 - 10.5 K/uL 10.8  8.8    Hemoglobin 12.0 - 15.0 g/dL 87.7  86.8    Hematocrit 36.0 - 46.0 % 36.5  38.8    Platelets 150 - 400 K/uL 225  256       Assessment/Plan: PPD #1 SVD, doing well.  Low placenta with bleeding in labor- stable CBC. No concerns. Ok for discharge home today if baby ready Rh Neg, Baby also Rh neg  Rub Immnune  Routine pp care, call parameters dw pt    LOS: 1 day   Robbi JONELLE Render, MD 10/28/2023, 7:04 AM

## 2023-10-28 NOTE — Progress Notes (Signed)
 Patient ID: Elizabeth [name], female   DOB: 28-Nov-1985, 38 y.o.   MRN: [id number] RN called, parents ready to give baby preservative free vit K shot to proceed with circumcision before discharge  Baby's orders for circumcision placed

## 2023-10-28 NOTE — Discharge Summary (Signed)
 Postpartum Discharge Summary     Patient Name: Elizabeth [name] DOB: [birth year] MRN: [id number]  Date of admission: 10/27/2023 Delivery date:10/27/2023 Delivering provider: Nevah [name] Date of discharge: 10/28/2023  Admitting diagnosis: vaginal bleeding. Low lying placenta. Early labor  Intrauterine pregnancy: [redacted]w[redacted]d     Secondary diagnosis:   Low-lying placenta Intrapartum bleeding but stable  Postpartum care following vaginal delivery 7/19  First degree perineal laceration Rh negative, baby also Rh negative   Additional problems: None    Discharge diagnosis: Term Pregnancy Delivered                                              Post partum procedures:None Augmentation: AROM and Pitocin  Complications: None  Hospital course: Onset of Early Labor With Vaginal Delivery      38 y.o. yo [id number] at [redacted]w[redacted]d was admitted in Latent Labor on 10/27/2023. Labor course was complicated by bleeding at home and in hospital. She was advised labor augmentation to expedite delivery to avoid ongoing bleeding andf risk of C-section. She was started on low dose pitocin , AROM at 5 cm and delivered SVD quickly without pain meds.  Membrane Rupture Time/Date: 8:20 AM,10/27/2023  Delivery Method:Vaginal, Spontaneous Operative Delivery:N/A Episiotomy: None Lacerations:  1st degree;Perineal Patient had a postpartum course complicated by none.  She is ambulating, tolerating a regular diet, passing flatus, and urinating well. Patient is discharged home in stable condition on 10/28/23.  Newborn Data: Birth date:10/27/2023 Birth time:9:30 AM Gender:Female Living status:Living Apgars:8 ,9  Weight:4054 g  Rhophylac :N/A MMR:No T-DaP:declined Flu: N/A RSV Vaccine received: No Transfusion:No Immunizations administered: Immunization History  Administered Date(s) Administered   Influenza Split 01/28/2013   Rho (D) Immune Globulin  03/08/2012   Tdap 01/28/2013    Physical exam  Vitals:   10/27/23 1523  10/27/23 2015 10/28/23 0015 10/28/23 0556  BP: 105/65 112/77 116/62 107/72  Pulse: 73 74 81 72  Resp: 16 16 16    Temp: 98.3 F (36.8 C) 98 F (36.7 C) 98.3 F (36.8 C) 98 F (36.7 C)  TempSrc: Oral Oral Oral   SpO2: 99% 100% 100% 100%  Weight:      Height:       General: alert, cooperative, and no distress Lochia: appropriate Uterine Fundus: firm Incision: Healing well with no significant drainage DVT Evaluation: No evidence of DVT seen on physical exam. Negative Homan's sign. Labs:    Latest Ref Rng & Units 10/28/2023    5:19 AM 10/27/2023    3:38 AM   CBC  WBC 4.0 - 10.5 K/uL 10.8  8.8    Hemoglobin 12.0 - 15.0 g/dL 87.7  86.8    Hematocrit 36.0 - 46.0 % 36.5  38.8    Platelets 150 - 400 K/uL 225  256       Edinburgh Score:    10/27/2023    8:13 PM  Edinburgh Postnatal Depression Scale Screening Tool  I have been able to laugh and see the funny side of things. 0  I have looked forward with enjoyment to things. 0  I have blamed myself unnecessarily when things went wrong. 0  I have been anxious or worried for no good reason. 0  I have felt scared or panicky for no good reason. 0  Things have been getting on top of me. 0  I have been so unhappy that I have had difficulty  sleeping. 0  I have felt sad or miserable. 0  I have been so unhappy that I have been crying. 0  The thought of harming myself has occurred to me. 0  Edinburgh Postnatal Depression Scale Total 0      After visit meds:  Allergies as of 10/28/2023       Reactions   Neomycin Swelling   Eyelid swell shut        Medication List     TAKE these medications    acetaminophen  325 MG tablet Commonly known as: Tylenol  Take 2 tablets (650 mg total) by mouth every 4 (four) hours as needed (for pain scale < 4).   ibuprofen  600 MG tablet Commonly known as: ADVIL  Take 1 tablet (600 mg total) by mouth every 6 (six) hours.   prenatal multivitamin Tabs tablet Take 1 tablet by mouth daily at 12  noon.         Discharge home in stable condition Infant Feeding: Breast Infant Disposition:home with mother Discharge instruction: per After Visit Summary and Postpartum booklet. Activity: Advance as tolerated. Pelvic rest for 6 weeks.  Diet: routine diet Anticipated Birth Control: Unsure Postpartum Appointment:6 weeks Additional Postpartum F/U: as needed   Future Appointments:No future appointments. Follow up Visit:  Follow-up Information     Elizabeth Knock, MD Follow up in 6 week(s).   Specialty: Obstetrics and Gynecology Contact information: [address] [place] [address] [phone]                     10/28/2023 [name] Elizabeth Barbette, MD

## 2023-11-02 ENCOUNTER — Inpatient Hospital Stay (HOSPITAL_COMMUNITY): Payer: Self-pay

## 2023-11-02 ENCOUNTER — Inpatient Hospital Stay (HOSPITAL_COMMUNITY): Admission: RE | Admit: 2023-11-02 | Payer: Self-pay | Source: Home / Self Care | Admitting: Obstetrics & Gynecology

## 2023-11-14 ENCOUNTER — Telehealth (HOSPITAL_COMMUNITY): Payer: Self-pay | Admitting: *Deleted

## 2023-11-14 NOTE — Telephone Encounter (Signed)
 11/14/2023  Name: Jamyra Zweig MRN: [id number] DOB: 1985/08/02  Reason for Call:  Transition of Care Hospital Discharge Call  Contact Status: Patient Contact Status: Message  Language assistant needed:          Follow-Up Questions:    Van Postnatal Depression Scale:  In the Past 7 Days:    PHQ2-9 Depression Scale:     Discharge Follow-up:    Post-discharge interventions: NA  Mliss Sieve, RN 11/14/2023 14:08
